# Patient Record
Sex: Male | Born: 1950 | Race: White | Hispanic: No | Marital: Married | Smoking: Current some day smoker
Health system: Southern US, Community
[De-identification: ages and names within clinical notes are randomized; demographics above are authoritative.]

## PROBLEM LIST (undated history)

## (undated) DIAGNOSIS — M5412 Radiculopathy, cervical region: Secondary | ICD-10-CM

## (undated) DIAGNOSIS — M779 Enthesopathy, unspecified: Secondary | ICD-10-CM

## (undated) DIAGNOSIS — R55 Syncope and collapse: Secondary | ICD-10-CM

## (undated) DIAGNOSIS — F419 Anxiety disorder, unspecified: Secondary | ICD-10-CM

## (undated) DIAGNOSIS — I2510 Atherosclerotic heart disease of native coronary artery without angina pectoris: Secondary | ICD-10-CM

## (undated) DIAGNOSIS — M47812 Spondylosis without myelopathy or radiculopathy, cervical region: Secondary | ICD-10-CM

## (undated) DIAGNOSIS — F319 Bipolar disorder, unspecified: Secondary | ICD-10-CM

## (undated) DIAGNOSIS — Z950 Presence of cardiac pacemaker: Secondary | ICD-10-CM

## (undated) DIAGNOSIS — I509 Heart failure, unspecified: Secondary | ICD-10-CM

## (undated) DIAGNOSIS — E785 Hyperlipidemia, unspecified: Secondary | ICD-10-CM

## (undated) DIAGNOSIS — E669 Obesity, unspecified: Secondary | ICD-10-CM

## (undated) DIAGNOSIS — I10 Essential (primary) hypertension: Secondary | ICD-10-CM

## (undated) DIAGNOSIS — M25569 Pain in unspecified knee: Secondary | ICD-10-CM

## (undated) DIAGNOSIS — F32A Depression, unspecified: Secondary | ICD-10-CM

## (undated) DIAGNOSIS — IMO0001 Reserved for inherently not codable concepts without codable children: Secondary | ICD-10-CM

## (undated) DIAGNOSIS — M542 Cervicalgia: Secondary | ICD-10-CM

## (undated) DIAGNOSIS — J449 Chronic obstructive pulmonary disease, unspecified: Secondary | ICD-10-CM

## (undated) DIAGNOSIS — I219 Acute myocardial infarction, unspecified: Secondary | ICD-10-CM

## (undated) DIAGNOSIS — G4733 Obstructive sleep apnea (adult) (pediatric): Secondary | ICD-10-CM

## (undated) DIAGNOSIS — R079 Chest pain, unspecified: Secondary | ICD-10-CM

## (undated) DIAGNOSIS — F329 Major depressive disorder, single episode, unspecified: Secondary | ICD-10-CM

## (undated) DIAGNOSIS — J45909 Unspecified asthma, uncomplicated: Secondary | ICD-10-CM

## (undated) HISTORY — DX: Obesity, unspecified: E66.9

## (undated) HISTORY — DX: Radiculopathy, cervical region: M54.12

## (undated) HISTORY — DX: Syncope and collapse: R55

## (undated) HISTORY — DX: Enthesopathy, unspecified: M77.9

## (undated) HISTORY — DX: Spondylosis without myelopathy or radiculopathy, cervical region: M47.812

## (undated) HISTORY — DX: Presence of cardiac pacemaker: Z95.0

## (undated) HISTORY — DX: Pain in unspecified knee: M25.569

## (undated) HISTORY — DX: Cervicalgia: M54.2

## (undated) HISTORY — DX: Heart failure, unspecified: I50.9

## (undated) HISTORY — DX: Atherosclerotic heart disease of native coronary artery without angina pectoris: I25.10

## (undated) HISTORY — DX: Chronic obstructive pulmonary disease, unspecified: J44.9

## (undated) HISTORY — DX: Chest pain, unspecified: R07.9

## (undated) HISTORY — DX: Unspecified asthma, uncomplicated: J45.909

## (undated) HISTORY — DX: Essential (primary) hypertension: I10

## (undated) HISTORY — DX: Obstructive sleep apnea (adult) (pediatric): G47.33

## (undated) HISTORY — DX: Hyperlipidemia, unspecified: E78.5

---

## 1973-01-14 HISTORY — PX: APPENDECTOMY: SHX54

## 1997-06-29 ENCOUNTER — Ambulatory Visit (HOSPITAL_BASED_OUTPATIENT_CLINIC_OR_DEPARTMENT_OTHER): Admission: RE | Admit: 1997-06-29 | Discharge: 1997-06-29 | Payer: Self-pay | Admitting: Orthopedic Surgery

## 1997-09-02 ENCOUNTER — Encounter: Admission: RE | Admit: 1997-09-02 | Discharge: 1997-09-02 | Payer: Self-pay | Admitting: *Deleted

## 1998-02-08 ENCOUNTER — Emergency Department (HOSPITAL_COMMUNITY): Admission: EM | Admit: 1998-02-08 | Discharge: 1998-02-08 | Payer: Self-pay | Admitting: Emergency Medicine

## 1998-02-08 ENCOUNTER — Encounter: Payer: Self-pay | Admitting: Emergency Medicine

## 1998-02-10 ENCOUNTER — Ambulatory Visit: Admission: RE | Admit: 1998-02-10 | Discharge: 1998-02-10 | Payer: Self-pay | Admitting: *Deleted

## 1998-02-10 ENCOUNTER — Encounter: Payer: Self-pay | Admitting: *Deleted

## 1998-02-15 ENCOUNTER — Encounter: Admission: RE | Admit: 1998-02-15 | Discharge: 1998-02-15 | Payer: Self-pay | Admitting: *Deleted

## 1998-02-27 ENCOUNTER — Encounter: Admission: RE | Admit: 1998-02-27 | Discharge: 1998-05-28 | Payer: Self-pay | Admitting: *Deleted

## 1998-03-01 ENCOUNTER — Encounter: Admission: RE | Admit: 1998-03-01 | Discharge: 1998-05-30 | Payer: Self-pay | Admitting: *Deleted

## 1999-10-04 ENCOUNTER — Encounter: Payer: Self-pay | Admitting: Family Medicine

## 1999-10-04 ENCOUNTER — Ambulatory Visit (HOSPITAL_COMMUNITY): Admission: RE | Admit: 1999-10-04 | Discharge: 1999-10-04 | Payer: Self-pay | Admitting: Family Medicine

## 1999-11-04 ENCOUNTER — Ambulatory Visit (HOSPITAL_BASED_OUTPATIENT_CLINIC_OR_DEPARTMENT_OTHER): Admission: RE | Admit: 1999-11-04 | Discharge: 1999-11-04 | Payer: Self-pay | Admitting: Family Medicine

## 2000-03-29 ENCOUNTER — Ambulatory Visit (HOSPITAL_COMMUNITY): Admission: RE | Admit: 2000-03-29 | Discharge: 2000-03-29 | Payer: Self-pay | Admitting: Orthopedic Surgery

## 2000-04-25 ENCOUNTER — Ambulatory Visit (HOSPITAL_COMMUNITY): Admission: RE | Admit: 2000-04-25 | Discharge: 2000-04-25 | Payer: Self-pay | Admitting: Orthopedic Surgery

## 2000-08-28 ENCOUNTER — Emergency Department (HOSPITAL_COMMUNITY): Admission: EM | Admit: 2000-08-28 | Discharge: 2000-08-28 | Payer: Self-pay | Admitting: Emergency Medicine

## 2000-11-21 ENCOUNTER — Encounter: Payer: Self-pay | Admitting: Orthopedic Surgery

## 2000-11-27 ENCOUNTER — Observation Stay (HOSPITAL_COMMUNITY): Admission: RE | Admit: 2000-11-27 | Discharge: 2000-11-28 | Payer: Self-pay | Admitting: Orthopedic Surgery

## 2000-12-15 ENCOUNTER — Encounter: Admission: RE | Admit: 2000-12-15 | Discharge: 2001-01-12 | Payer: Self-pay | Admitting: Orthopedic Surgery

## 2001-05-22 ENCOUNTER — Ambulatory Visit (HOSPITAL_COMMUNITY): Admission: RE | Admit: 2001-05-22 | Discharge: 2001-05-23 | Payer: Self-pay | Admitting: Cardiology

## 2001-05-22 ENCOUNTER — Encounter: Payer: Self-pay | Admitting: Cardiology

## 2001-11-11 ENCOUNTER — Observation Stay (HOSPITAL_COMMUNITY): Admission: RE | Admit: 2001-11-11 | Discharge: 2001-11-13 | Payer: Self-pay | Admitting: Orthopedic Surgery

## 2001-11-11 HISTORY — PX: SHOULDER ARTHROSCOPY: SHX128

## 2001-11-19 ENCOUNTER — Ambulatory Visit (HOSPITAL_COMMUNITY): Admission: RE | Admit: 2001-11-19 | Discharge: 2001-11-19 | Payer: Self-pay | Admitting: Internal Medicine

## 2001-11-23 ENCOUNTER — Encounter: Admission: RE | Admit: 2001-11-23 | Discharge: 2002-02-21 | Payer: Self-pay | Admitting: Orthopedic Surgery

## 2002-02-12 ENCOUNTER — Ambulatory Visit (HOSPITAL_BASED_OUTPATIENT_CLINIC_OR_DEPARTMENT_OTHER): Admission: RE | Admit: 2002-02-12 | Discharge: 2002-02-12 | Payer: Self-pay | Admitting: Internal Medicine

## 2002-06-11 ENCOUNTER — Ambulatory Visit (HOSPITAL_COMMUNITY): Admission: RE | Admit: 2002-06-11 | Discharge: 2002-06-12 | Payer: Self-pay | Admitting: Internal Medicine

## 2002-06-11 ENCOUNTER — Encounter: Payer: Self-pay | Admitting: Internal Medicine

## 2002-06-11 DIAGNOSIS — Z950 Presence of cardiac pacemaker: Secondary | ICD-10-CM

## 2002-06-11 HISTORY — PX: PACEMAKER INSERTION: SHX728

## 2002-06-11 HISTORY — DX: Presence of cardiac pacemaker: Z95.0

## 2002-06-12 ENCOUNTER — Encounter: Payer: Self-pay | Admitting: Internal Medicine

## 2002-08-04 ENCOUNTER — Encounter: Payer: Self-pay | Admitting: Emergency Medicine

## 2002-08-05 ENCOUNTER — Inpatient Hospital Stay (HOSPITAL_COMMUNITY): Admission: EM | Admit: 2002-08-05 | Discharge: 2002-08-06 | Payer: Self-pay | Admitting: Emergency Medicine

## 2002-08-06 ENCOUNTER — Encounter: Payer: Self-pay | Admitting: Cardiology

## 2003-01-16 ENCOUNTER — Emergency Department (HOSPITAL_COMMUNITY): Admission: AD | Admit: 2003-01-16 | Discharge: 2003-01-16 | Payer: Self-pay | Admitting: Family Medicine

## 2003-10-07 ENCOUNTER — Ambulatory Visit: Payer: Self-pay | Admitting: Internal Medicine

## 2003-10-20 ENCOUNTER — Ambulatory Visit: Payer: Self-pay | Admitting: Internal Medicine

## 2004-03-16 ENCOUNTER — Encounter: Admission: RE | Admit: 2004-03-16 | Discharge: 2004-06-14 | Payer: Self-pay | Admitting: Orthopedic Surgery

## 2004-04-05 ENCOUNTER — Ambulatory Visit: Payer: Self-pay | Admitting: Internal Medicine

## 2004-04-17 ENCOUNTER — Ambulatory Visit: Payer: Self-pay | Admitting: Family Medicine

## 2004-05-30 ENCOUNTER — Ambulatory Visit: Payer: Self-pay | Admitting: Family Medicine

## 2004-07-11 ENCOUNTER — Ambulatory Visit: Payer: Self-pay | Admitting: Internal Medicine

## 2004-07-13 ENCOUNTER — Ambulatory Visit: Payer: Self-pay | Admitting: Family Medicine

## 2004-08-28 ENCOUNTER — Ambulatory Visit: Payer: Self-pay | Admitting: Family Medicine

## 2004-10-31 ENCOUNTER — Encounter: Admission: RE | Admit: 2004-10-31 | Discharge: 2005-01-29 | Payer: Self-pay | Admitting: Orthopedic Surgery

## 2005-03-22 ENCOUNTER — Ambulatory Visit: Payer: Self-pay | Admitting: Family Medicine

## 2005-05-10 ENCOUNTER — Ambulatory Visit: Admission: RE | Admit: 2005-05-10 | Discharge: 2005-05-10 | Payer: Self-pay | Admitting: Orthopedic Surgery

## 2005-05-22 ENCOUNTER — Ambulatory Visit: Payer: Self-pay | Admitting: Internal Medicine

## 2005-05-23 ENCOUNTER — Ambulatory Visit: Payer: Self-pay | Admitting: Internal Medicine

## 2005-06-17 ENCOUNTER — Ambulatory Visit (HOSPITAL_COMMUNITY): Admission: RE | Admit: 2005-06-17 | Discharge: 2005-06-17 | Payer: Self-pay | Admitting: Orthopedic Surgery

## 2005-06-17 HISTORY — PX: KNEE ARTHROSCOPY: SUR90

## 2005-10-20 ENCOUNTER — Emergency Department (HOSPITAL_COMMUNITY): Admission: EM | Admit: 2005-10-20 | Discharge: 2005-10-20 | Payer: Self-pay | Admitting: Family Medicine

## 2005-12-08 ENCOUNTER — Emergency Department (HOSPITAL_COMMUNITY): Admission: EM | Admit: 2005-12-08 | Discharge: 2005-12-08 | Payer: Self-pay | Admitting: Emergency Medicine

## 2006-04-23 ENCOUNTER — Emergency Department (HOSPITAL_COMMUNITY): Admission: EM | Admit: 2006-04-23 | Discharge: 2006-04-23 | Payer: Self-pay | Admitting: Emergency Medicine

## 2006-04-25 ENCOUNTER — Ambulatory Visit: Payer: Self-pay | Admitting: Internal Medicine

## 2007-02-24 ENCOUNTER — Encounter (INDEPENDENT_AMBULATORY_CARE_PROVIDER_SITE_OTHER): Payer: Self-pay | Admitting: Family Medicine

## 2008-04-14 IMAGING — CR DG CHEST 2V
2 series · 2 of 2 positions shown · non-contrast
Comparison: 01/16/03.

CLINICAL DATA: Pre-op for knee surgery.
 CHEST - 2 VIEW:

[view not recorded (1 of 2)]
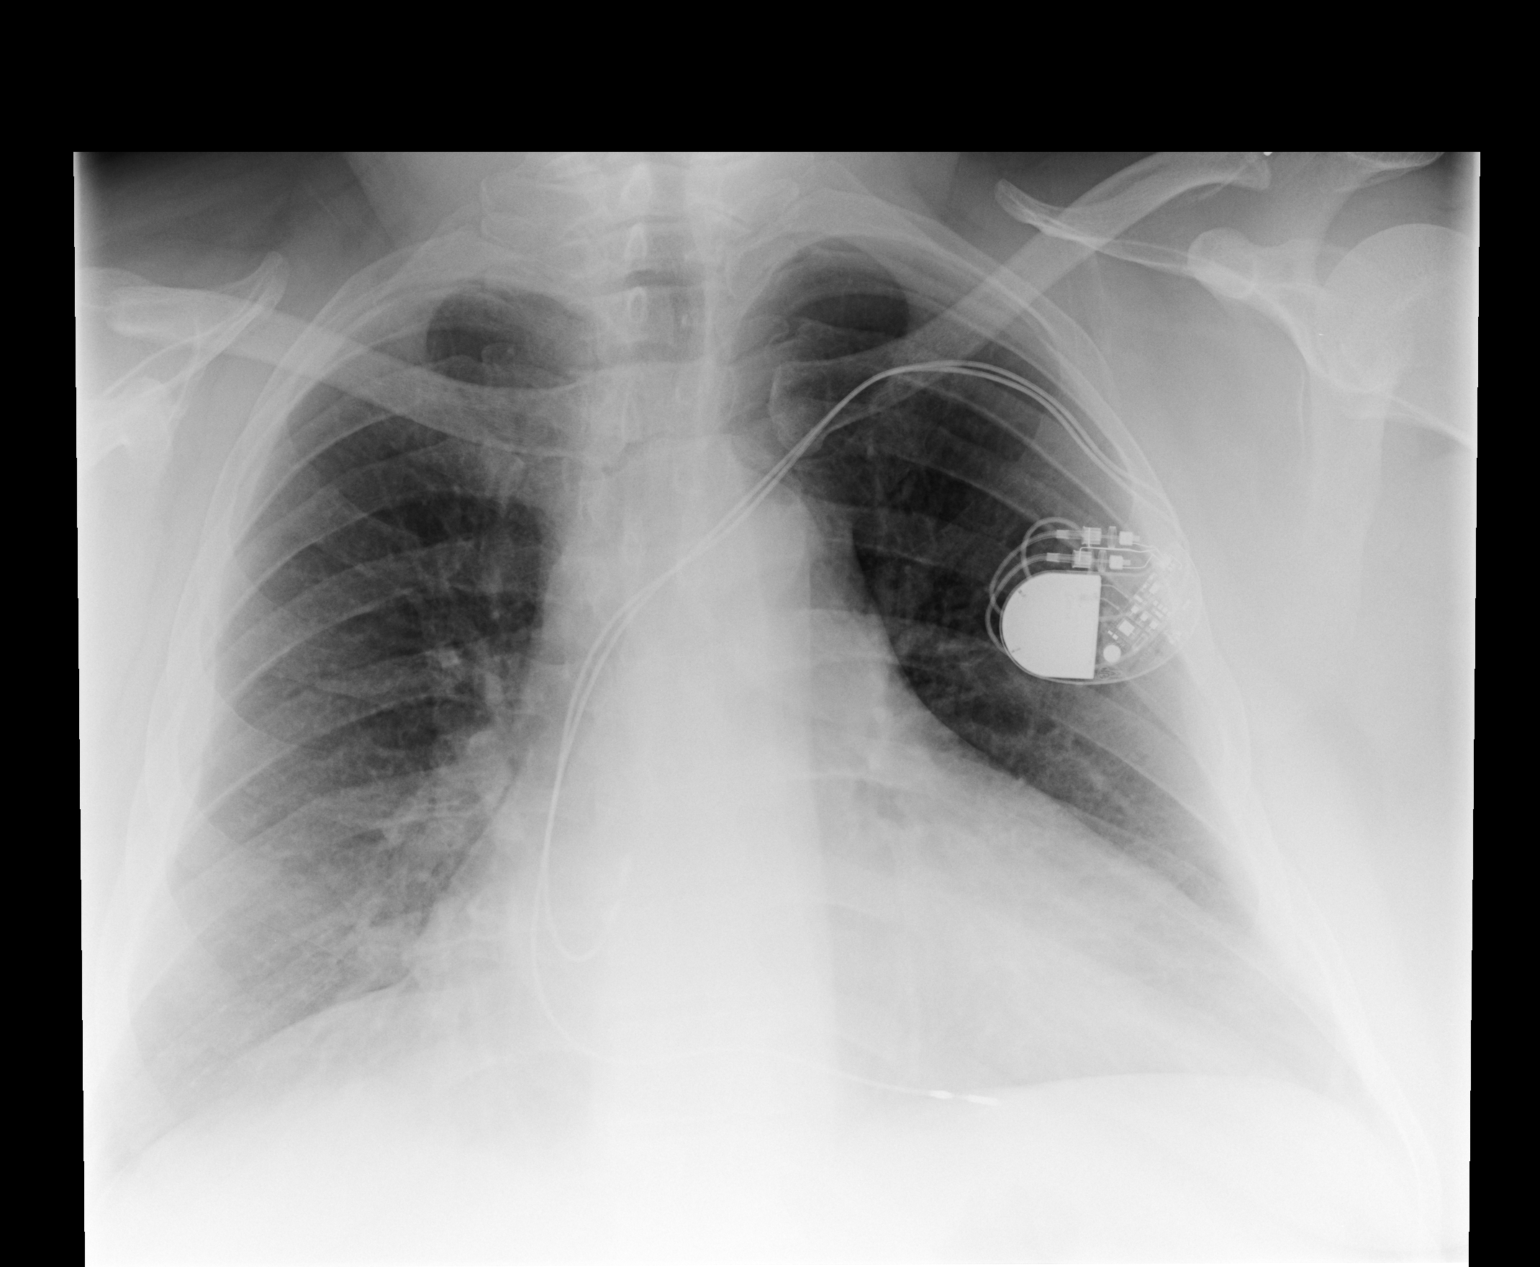

[view not recorded (2 of 2)]
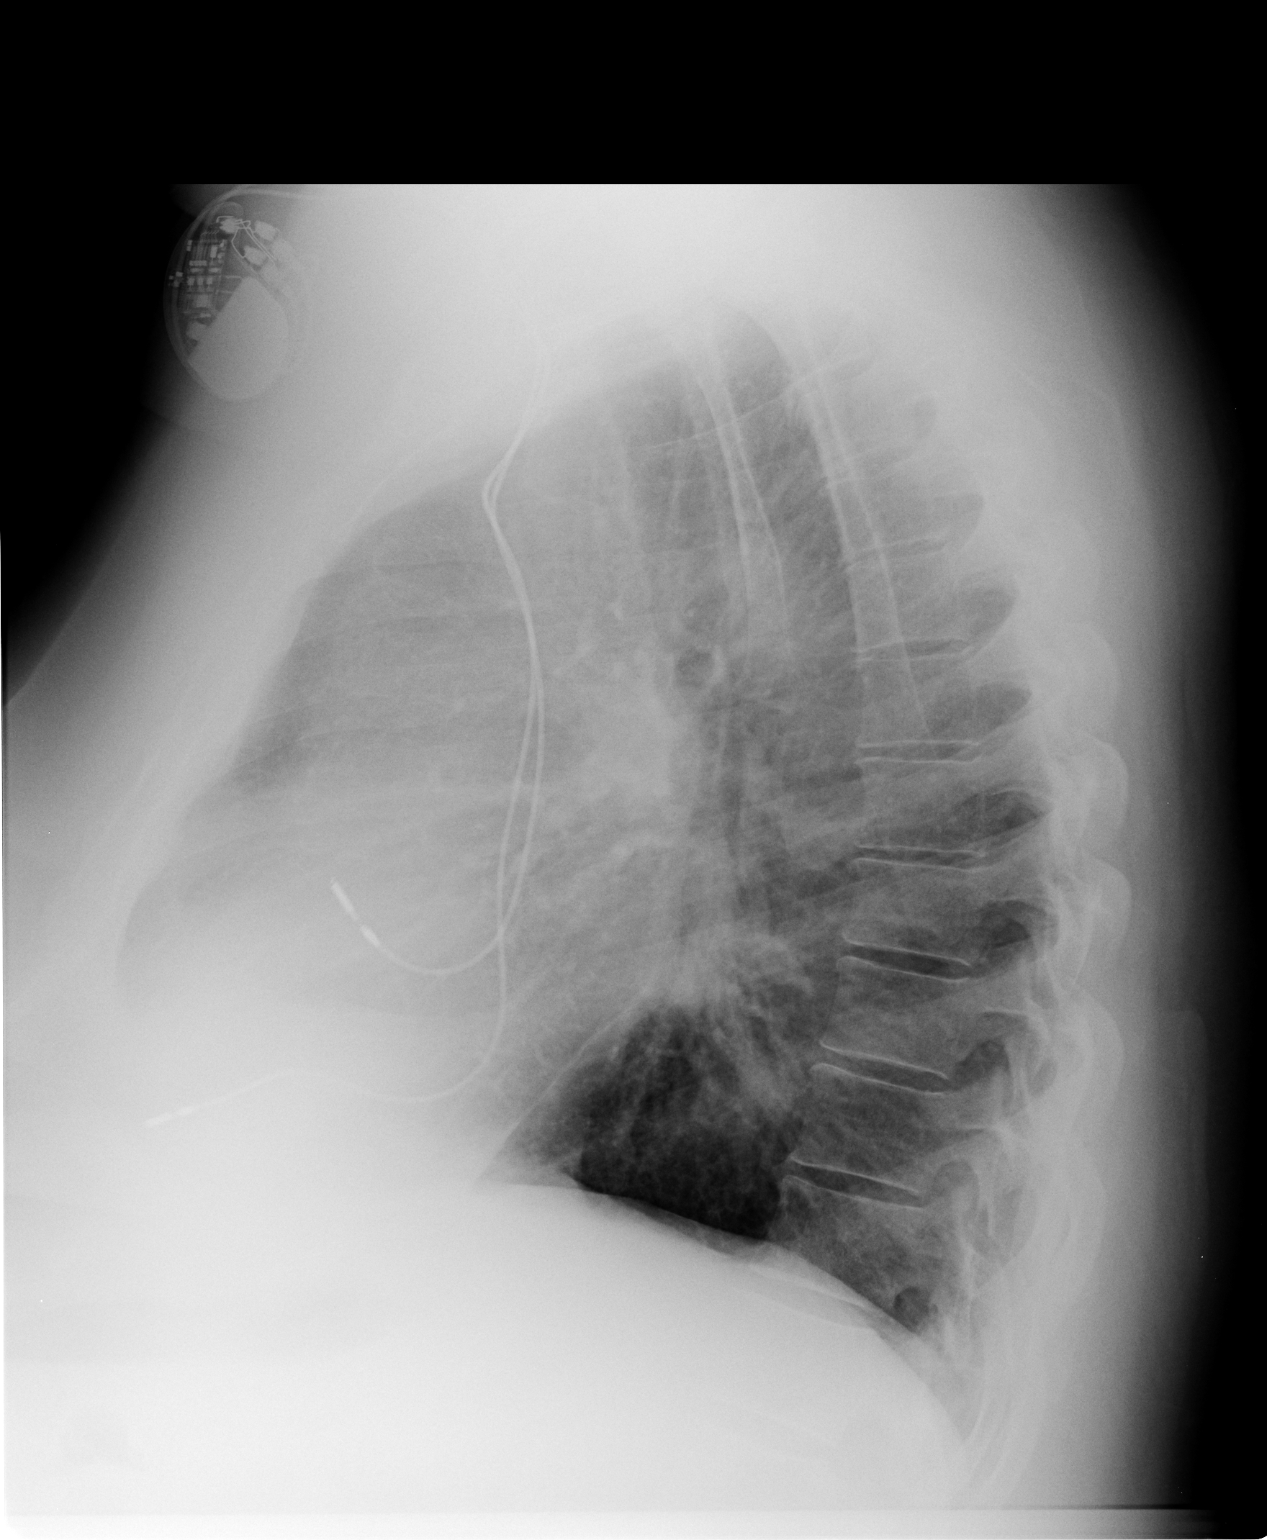

[2 of 2 positions shown; findings below may reference images not displayed]

FINDINGS: The heart is enlarged with a dual-lead pacer.  No congestive heart failure or active disease.  The lungs are mildly hyperaerated.
IMPRESSION: Cardiomegaly with dual-lead pacer ? no active disease.

## 2009-09-04 ENCOUNTER — Encounter (INDEPENDENT_AMBULATORY_CARE_PROVIDER_SITE_OTHER): Payer: Self-pay | Admitting: *Deleted

## 2009-11-03 ENCOUNTER — Encounter: Payer: Self-pay | Admitting: Internal Medicine

## 2010-02-13 NOTE — Letter (Signed)
Summary: Device-Delinquent Check  [facility], Main Office  [address]   [place], [place] [address]   Phone: [phone]  Fax: [phone]     September 04, 2009 MRN: [id number]   [facility] [address] [place], [place]  [address]   Dear Mr. Giovanelli,  According to our records, you have not had your implanted device checked in the recommended period of time.  We are unable to determine appropriate device function without checking your device on a regular basis.  Please call our office to schedule an appointment, with Dr. Ladona Ridgel,  as soon as possible.  If you are having your device checked by another physician, please call us so that we may update our records.  Thank you,  Altha Harm, LPN  September 04, 2009 8:37 AM  [facility]  certified mail

## 2010-02-13 NOTE — Cardiovascular Report (Signed)
Summary: Certified Letter - Returned  Certified Letter - Delivered   Imported By: Debby Freiberg 11/13/2009 14:44:34  _____________________________________________________________________  External Attachment:    Type:   Image     Comment:   External Document

## 2010-06-01 NOTE — H&P (Signed)
[name], [name]                         ACCOUNT NO.:  [id number]   MEDICAL RECORD NO.:  [id number]                   PATIENT TYPE:  INP   LOCATION:  2025                                 FACILITY:  [facility]   PHYSICIAN:  Rollene Rotunda, M.D.                DATE OF BIRTH:  [birth year]   DATE OF ADMISSION:  08/04/2002  DATE OF DISCHARGE:                                HISTORY & PHYSICAL   PRIMARY CARE PHYSICIAN:  Dr. Luciana Axe.   PULMONOLOGIST:  Dr. Maple Hudson.   CARDIOLOGIST:  Dr. Lewayne Bunting.   REASON FOR EVALUATION:  To evaluate patient for chest pain.   HISTORY OF PRESENT ILLNESS:  The patient is a 60 year old gentleman with a  history of nonobstructive coronary disease in the past, as described below.  He reports that for three days he has had chest discomfort.  This is a  midsternal pressure.  It was off and on; however, it became more severe and  more prolonged.  He took about 4 nitroglycerins over the past three days.  Each time, he would have resolution of the discomfort.  However, when the  discomfort became most intense this evening, he presented to the emergency  room.  He is currently pain free.  He reports that the discomfort was 5/10  at its worst.  It was associated with diaphoresis and shortness of breath.  There was no radiation.  He has had similar discomfort in the past.   PAST MEDICAL HISTORY:  1. Syncope with bradycardia, status post permanent pacemaker placement, Jun 11, 2002.  2. Sleep apnea with CPAP.  3. Hyperlipidemia.  4. Bronchospasm.  5. Depression.  6. Hypertension.  7. Morbid obesity.  8. Tobacco abuse.  9. Duodenal ulcer.  10.      Arthritis.  11.      Bursitis.   PAST SURGICAL HISTORY:  1. Bilateral shoulder surgery.  2. Bilateral carpal tunnel surgery.  3. Appendectomy.   ALLERGIES:  PENICILLIN.   MEDICATIONS:  1. Lescol 40 mg q.d.  2. Omeprazole 40 mg q.h.s.  3. Benazepril 20 mg q.a.m.  4. Zoloft 200 mg q.a.m.  5.  Hydrochlorothiazide 12.5 mg q.d.  6. Aspirin 81 mg q.d.  7. Bextra 200 mg b.i.d.  8. Celebrex 100 mg q.12h.  9. Trental 15 mg p.r.n.  10.      Advair 250/50, 1 puff b.i.d.  11.      Combivent 2 puffs b.i.d.  12.      Albuterol 2 puffs q.d.  13.      Nasonex 50 mg 2 puffs, both nostrils, b.i.d.  14.      Nasacort 2 puffs b.i.d.  15.      Pulmicort 200 mg 2 puffs b.i.d.  16.      Allegra 60 mg q.12h.   SOCIAL HISTORY:  The patient is disabled from his arthritis.  He does,  apparently, do some work, however.  He is divorced, although he has a male  friend in the room with him.  He has three children, one step-child.  He  smokes two packs per day.   FAMILY HISTORY:  Is contributory for his father having a myocardial  infarction at age 55 and dying.  His mother has congestive heart failure and  was in her 67s when she developed this.   REVIEW OF SYSTEMS:  As stated in the HPI.  Positive for chronic dyspnea,  chronic back pain.  Negative for other systems.   PHYSICAL EXAMINATION:  GENERAL:  The patient is in no acute distress.  VITAL SIGNS:  Blood pressure 138/70, heart rate 88 and regular.  HEENT:  Eyes unremarkable.  Pupils equal, round, react to light.  Fundi not  visualized.  Oral mucosa unremarkable.  Poor dentition.  NECK:  No jugular venous distention; waveform within normal limits.  Carotid  upstroke brisk and symmetric.  No bruits.  No thyromegaly.  LYMPHATICS:  No obvious cervical, axillary, inguinal adenopathy (exam  compromised by obesity).  LUNGS:  Diffuse expiratory wheezes but no crackles.  BACK:  No costovertebral angle tenderness.  CHEST:  Unremarkable.  HEART:  PMI not displaced or sustained.  S1 and S2 within normal limits.  No  S3, no S4, no murmurs.  ABDOMEN:  Morbidly obese.  Positive bowel sounds.  Normal frequency and  pitch.  No bruits, rebound, guarding.  No midline pulsatile mass.  No  organomegaly.  SKIN:  No rash.  No masses.  EXTREMITIES:  2+ pulses  throughout.  No edema.  No cyanosis.  No clubbing.  NEUROLOGIC:  Oriented to person, place and time.  Cranial nerves II-XII  grossly intact.  Motor grossly intact.   LABORATORY DATA:  Sodium 138, potassium 3.8, chloride 108, BUN 16.  CK-MB  3.3, troponin less than 0.5.   EKG:  Sinus rhythm, rate 85, poor anterior R wave progression, possible old  septal infarct.  Nonspecific T wave flattening throughout.   ASSESSMENT/PLAN:  1. Chest discomfort.  The patient's chest discomfort is worrisome for     unstable angina.  Non-obstructive disease in 2003 and continues to smoke.     Given the description, I think it is best to treat him with heparin and     aspirin.  I will increase this to 325 mg.  I will avoid beta blockers     because of bronchospasm.  He will need an elective cardiac     catheterization.  He understands the risks and benefits and agrees to     proceed.  2. Morbid obesity.  He will be counseled about weight loss with diet.  3. Tobacco.  We will get a stop-smoking consult.  4. Hyperlipidemia.  He will continue his Lescol, and we will check a lipid     profile.  5. Hypertension.  He will continue the medications as listed.  6. Bronchospasm.  Again, we will avoid a beta blocker and continue the     inhalers and other medicines as listed.  7. Sleep apnea.  He can use his home CPAP machine while he is here.  8. Status post pacemaker.                                               Rollene Rotunda, M.D.  JH/MEDQ  D:  08/04/2002  T:  08/05/2002  Job:  [id number]   cc:   Barton Fanny, M.D.    cc:   Barton Fanny, M.D.

## 2010-06-01 NOTE — Discharge Summary (Signed)
NAMESARVESH, MEDDAUGH                         ACCOUNT NO.:  [id number]   MEDICAL RECORD NO.:  [id number]                   PATIENT TYPE:  OIB   LOCATION:  4706                                 FACILITY:  [facility]   PHYSICIAN:  Doylene Canning. Ladona Ridgel, M.D.               DATE OF BIRTH:  09-05-1950   DATE OF ADMISSION:  06/11/2002  DATE OF DISCHARGE:  06/12/2002                           DISCHARGE SUMMARY - REFERRING   BRIEF HISTORY:  The patient is a 60 year old male who was seen in the office  on Jun 08, 2002.  He has a history of hypertension, obesity, treated  hyperlipidemia, obstructive sleep apnea, intermittent bronchospasm and a  history of minimal coronary artery disease.  He was seen on May 24, 2002, by  Dian Queen, P.A.-C for evaluation of syncope, palpitations and presyncope.  The patient had no further syncope.  He did have a 2D echocardiogram  performed that showed normal left ventricular function.  He did have mild  aortic regurgitation.  A Holter monitor revealed significant bradycardia  with pauses as long as four seconds.  The patient was also found to have  Mobitz type II block on several occasions.  The decision was made to admit  the patient to [facility] for placement of a permanent pacemaker.   PAST MEDICAL HISTORY:  The patient had a cardiac catheterization April 2003,  that revealed a 25% proximal LAD, 30% ostial first diagonal and a normal  circumflex.  The first obtuse marginal had a 60% lesion.  The PDA had a 25%  lesion.  He had normal LV function.  He has a history of hypertension,  hyperlipidemia, palpitations and syncope, obstructive sleep apnea, COPD with  intermittent bronchospasm, DJD with bilateral shoulder arthritis, rotator  cuff impingement, status post bilateral rotator cuff surgery.  He has a  history of obesity.   ALLERGIES:  No known drug allergies.   MEDICATIONS:  1. Lescol 20 mg daily.  2. Lotensin 20 mg daily.  3. Prilosec 20 mg  b.i.d.  4. Advair 250/50 two puffs b.i.d.  5. Combivent metered dose inhaler, two puffs b.i.d.  6. Zoloft 100 mg, two q.a.m.  7. Hydrochlorothiazide 25 mg daily.  8. Nasacort, dosage not available.  9. Allegra 180 mg daily.  10.      Celebrex 200 mg daily.  11.      CPAP at night.  12.      Albuterol p.r.n.  13.      Tramadol 50 mg p.r.n.  14.      Nitroglycerin p.r.n.   SOCIAL HISTORY:  The patient continues to smoke.  He has smoked x40 years,  20-30 pack year history.   HOSPITAL COURSE:  As noted, the patient was admitted to [facility]  for placement of a permanent pacemaker secondary to syncope as well as  presyncope with documented four second pauses and Mobitz type II AV block.  Dr. Ladona Ridgel discussed the risks and benefits on Jun 11, 2002.  The patient  underwent placement of a St. Jude pulse generator permanent pacemaker using  Pacesetter leads.  The patient tolerated this well.   A followup chest x-ray showed COPD but no sign of any pneumothorax.  The  patient was seen on rounds the following day.  He appeared to be doing well.  Arrangements were made to discharge the patient in stable and improved  condition.   LABORATORY DATA:  Please see results of chest x-rays as noted above.  Cardiac enzymes were found to be negative x2.  There were no other  laboratories performed in the hospital.   DISCHARGE MEDICATIONS:  The patient is told to continue his home  medications.  He was told to take Tylenol as needed for pain.   DISCHARGE INSTRUCTIONS:  He was to limit the use of his left upper extremity  as instructed by Dr. Ladona Ridgel.  He was to be on a low-salt, low-fat diet.  He  was told to keep his pacer site clean and dry for one week.  He was told to  try to quit smoking.  He was told to call the office on Tuesday following  the holiday weekend for a followup appointment with Dr. Ladona Ridgel as well as an  appointment to have his pacemaker wound checked.  He was to follow up  with  his primary care physician, Dr. Beverley Fiedler, as needed.   DISCHARGE DIAGNOSES:  1. Status post St. Jude DDD permanent pacemaker performed Jun 11, 2002, by     Dr. Lewayne Bunting, secondary to second degree atrioventricular block, type     2, associated with presyncope and four second pauses.  The patient also     had syncope approximately six weeks ago.  2. Minimal coronary artery disease by catheterization April 2003.  3. Treated hypertension.  4. Treated hyperlipidemia.  5. Obstructive sleep apnea.  6. History of bilateral rotator cuff problems.  7. Chronic obstructive pulmonary disease with intermittent bronchospasm.  8. Continued tobacco use.     Kinnie Scales. Rinehuls, P.A.-C  [facility]            Doylene Canning. Ladona Ridgel, M.D.    DLR/MEDQ  D:  06/12/2002  T:  06/12/2002  Job:  [id number]   cc:   Fanny Dance. Rankins, M.D.  [address]  [place]  [place] [address]  Fax: [phone]

## 2010-06-01 NOTE — Discharge Summary (Signed)
[place]  Patient:    CALEEL, KINER Visit Number: [id number] MRN: [id number]          Service Type: CAT Location: 6500 [id number] [id number] Attending Physician:  Rollene Rotunda Dictated by:   Rozell Searing, P.A. Admit Date:  05/22/2001 Disc. Date: 05/23/01   CC:         Barton Fanny, M.D.   Referring Physician Discharge [place]  PROCEDURES:  Coronary angiogram.  REASON FOR ADMISSION:  Please refer to dictated admission note.  HOSPITAL COURSE:  The patient presented for elective coronary angiography, performed by Rollene Rotunda, M.D. (see catheterization report for full details), which revealed moderate coronary artery disease with normal left ventricular function.  Specifically, Rollene Rotunda, M.D., noted 25% proximal/mid LAD, 30% ostial DX1, 60% proximal/25% mid OMO, diffuse luminal irregularities of RCA, and 25% ostial PDA.  Rollene Rotunda, M.D., recommended aggressive risk factor modification and outpatient pharmacologic stress Cardiolite for assessment of CFX lesion.  The patient was kept for overnight observation of a minimal right groin hematoma and oozing.  Follow-up evaluation the following morning revealed no presence of hematoma or bruit and the patient was cleared for discharge in hemodynamically stable condition.  No new medication adjustments this admission.  MEDICATIONS AT DISCHARGE:  Previous home medication regimen.  ACTIVITY:  No heavy lifting, strenuous activity, or driving x 2 days.  DIET:  Low-fat/cholesterol diet.  SPECIAL INSTRUCTIONS:  The patient is strongly advised to discontinue smoking tobacco.  FOLLOW-UP:  The patient is scheduled for adenosine Cardiolite on Tuesday, May 26, 2001, at 9:30 a.m. at [facility].  The patient is scheduled to follow up with Rozell Searing, P.A., on Jun 03, 2001, at 11 a.m.  DISCHARGE DIAGNOSES: 1. Moderate coronary artery disease.    a. Coronary angiogram on May 22, 2001.    b. Normal coronary angiogram in 2000. 2. Multiple cardiac risk factors.    a. Hypertension.    b. Dyslipidemia.    c. Tobacco.    d. Family history of premature coronary artery disease. 3. History of duodenal ulcer. 4. Sleep apnea. 5. Asthma. 6. Depression. Dictated by:   Rozell Searing, P.A. Attending Physician:  Rollene Rotunda DD:  05/23/01 TD:  05/23/01 Job: [id number]

## 2010-06-01 NOTE — Op Note (Signed)
[facility]  Patient:    [name], [name] Visit Number: [id number] MRN: [id number]          Service Type: SUR Location: 4W 0452 01 Attending Physician:  Verlee Rossetti. Dictated by:   Almedia Balls Ranell Patrick, M.D. Proc. Date: 11/27/00 Admit Date:  11/27/2000                             Operative Report  PREOPERATIVE DIAGNOSES: 1. Left wrist carpal tunnel syndrome. 2. Left shoulder impingement syndrome. 3. Left shoulder acromioclavicular joint arthritis.  POSTOPERATIVE DIAGNOSES: 1. Left wrist carpal tunnel syndrome. 2. Left shoulder impingement syndrome. 3. Left shoulder acromioclavicular joint arthritis.  PROCEDURES:  Left shoulder examination under anesthesia, diagnostic glenohumeral arthroscopy, intra-articular debridement of superior labrale fraying, arthroscopic bursectomy, subacromial decompression with acromioplasty, open distal clavicle excision, and left carpal tunnel release.  SURGEON:  Almedia Balls. Ranell Patrick, M.D.  FIRST ASSISTANT:  Marcie Bal. Troncale, P.A.-C.  ANESTHESIA:  General.  ESTIMATED BLOOD LOSS:  Minimal.  FLUID REPLACEMENT:  1300 cc crystalloid.  INSTRUMENT COUNTS:  Correct.  COMPLICATIONS:  None.  PREOPERATIVE:  Antibiotics were given.  HISTORY OF PRESENT ILLNESS:  The patient is a 60 year old gentleman who presents with longstanding symptoms of carpal tunnel syndrome.  He has had positive EMG nerve conduction studies, positive Phalens and Tinels, and has failed conservative management including splint wear and activity modification.  The patient also has longstanding impingement of left shoulder, worsening range of motion with difficulty with abduction and forward flexion, as well as internal rotation.  This is effecting his activities of daily living and interfering with sleep.  He also has failed conservative management for the shoulder to include injections in the subacromial space and rotator cuff strengthening  exercises as well as activity modifications.  After discussion with Mr. Ohms regarding options for management for both his carpal tunnel syndrome and his shoulder impingement and AC joint arthritis, he has elected to proceed with surgical treatment.  DESCRIPTION OF PROCEDURE:  After an adequate level of general anesthesia was achieved, 1 gram of Ancef was given preoperatively.  The patient was positioned in the supine position on the operating room table and a nonsterile tourniquet was placed on the left proximal arm and the left arm was then prepped and draped in the usual sterile fashion.  A standard longitudinal incision was created in the palm in line with the fourth ray for carpal tunnel release.  This was done using a 15 blade scalpel.  Dissection was carried across the wrist crease using an angled incision, crossing at approximately 45 degrees, extending for 1 cm out to the volar forearm.  This was taken sharply down through subcutaneous tissues using curved tenotomy scissors.  The superficial palmar fascia was identified and incised in line with the skin incision.  Next the transverse carpal ligament was identified and incised sharply using a knife under direct loupe visualization.  Care was taken to preserve the median nerve.  The transverse carpal ligament was released all the way into the palm, such that the operating surgeons finger could be placed in the palm without meeting resistance.  Care was taken to preserve the palmar arch blood supply as well.  The median nerve was identified.  It had a mild compressive deformity and there was some mild synovial inflammation in the carpal canal as well.  The release was carried into the volar forearm fascia which was released such that the finger  of the operating surgeon could be placed into the forearm compartment.  With the complete carpal tunnel release, the wound was thoroughly irrigated and closed using interrupted 4-0 Nylon  sutures in a vertical mattress fashion.  Next, attention was directed towards the shoulder.  An EUA was performed on the shoulder prior to starting the carpal tunnel procedure.  This was noted to have external rotation to 50 degrees, forward flexion to 140 degrees, abduction 120 degrees, and there was negative sulcus, negative anterior and posterior drawer, and no crepitus with glenohumeral grind.  At the completion of the EUA and completion of carpal tunnel surgery, a diagnostic glenohumeral arthroscopy was performed through anterior arthroscopic portals.  These were created in similar fashion with infiltration of skin with 0.5% Marcaine with epinephrine followed by incision with 11 blade scalpel.  Introduction of cannula into the joint using blunt obturators.  Diagnostic arthroscopy revealed some fraying of superior labrum, but intact attachment of the superior labrum and intact biceps junction.  The biceps tendon was delivered into the joint and noted to be normal.  There was a tiny amount of fraying noted at the very leading edge of the supraspinatus tendon, otherwise supraspinatus, infraspinatus, and teres minor tendon looked normal and normal insertions.  Glenohumeral articular cartilage was also without abnormality. Pouch was of normal size with no lose spot.  His anterior and inferior labrum was intact.  Subscapularis was visualized and noted to be intact.  Scope was placed in front of the shoulder looking posteriorly again to inspect the labrum to ensure that the overhanging that was identified was normal.  By elevating the labrum we were able to directly visualize the insertion of the labrum which was felt to be intact.  There was no peel back with external rotation and abduction.  At this point the glenohumeral arthroscopy was concluded.  The scope was placed in the subacromial space and a bursectomy was performed. Subacromial spurs were identified which were removed using a  motorized bur. An acromioplasty was then performed with care towards removing anterior and  lateral bone with a butcher block type technique.  Care was taken also to go all the way over to the [facility] joint where there was noted preoperatively to be osteophytes off both the acromion and the clavicle.  Osteophytes were removed there was well.  The CA ligament was released using the Arthrocare electrocautery unit.  Once complete acromioplasty was performed and subacromial space thoroughly irrigated, the scope was concluded.  A small saber incision was created over the distal clavicle in line with Langer line. This was done using a 15 blade scalpel.  Dissection was carried sharply down through subcutaneous tissues using Bovie electrocautery.  The deltotrapezius fascia was identified and split in line with its fibers over the distal clavicle.  Subperiosteal dissection of this clavicle was performed.  The distal 1 cm of the clavicle was removed using oscillating saw, beveling posteriorly.  The operating surgeons finger was then placed in the [facility] interval and the arm was maximally abducted and adducted across the body.  No impingement was noted.  The interval was irrigated, bone wax applied to the distal clavicle, and then the deltotrapezius interval was closed using interrupted figure-of-eight 0 Ethibond sutures with buried knots.  The subcutaneous was closed using 2-0 Vicryl and then the skin closed using 4-0 Monocryl subcuticular stitch.  Arthroscopic portals were also closed using 4-0 Monocryl, subcuticular stitch and sutures.  Steri-Strips were applied followed by a shoulder immobilizer and a short-arm  splint for the carpal tunnel with the wrist slightly dorsiflexed.  The patient will be kept overnight for observation and discharged home with Codman exercises and passive forward elevation, external and internal rotation. Dictated by:   Almedia Balls Ranell Patrick, M.D. Attending Physician:  Malon Kindle R. DD:  11/27/00 TD:  11/27/00 Job: [id number]

## 2010-06-01 NOTE — Op Note (Signed)
[name], [name]                         ACCOUNT NO.:  [id number]   MEDICAL RECORD NO.:  [id number]                   PATIENT TYPE:  OIB   LOCATION:  4706                                 FACILITY:  [facility]   PHYSICIAN:  Doylene Canning. Ladona Ridgel, M.D.               DATE OF BIRTH:  [birth year]   DATE OF PROCEDURE:  06/11/2002  DATE OF DISCHARGE:                                 OPERATIVE REPORT   PROCEDURE:  Implantation of a dual-chamber pacemaker.   INDICATIONS:  Symptomatic intermittent high-grade heart block with syncope.   INTRODUCTION:  The patient is a very pleasant middle-aged man with a history  of COPD, coronary artery disease, sleep apnea, who has had a history of  recurrent syncope.  He was subsequently found on cardiac monitoring to have  intermittent high-grade A-V block associated with symptoms.  He is now  referred for permanent pacemaker insertion.   DESCRIPTION OF PROCEDURE:  After informed consent was obtained, the patient  was taken to the diagnostic EP lab in a fasted state.  After the usual  preparation and draping, intravenous fentanyl and midazolam were given for  sedation.  A total of 30 mL of lidocaine was infiltrated into the left  infraclavicular region.  A 6 cm incision was carried out over this region  and electrocautery utilized to dissect down to the subpectoralis fascia.  T  en milliliters of contrast injected into the left upper extremity  demonstrated a patent left subclavian vein.  It was subsequently punctured  x2 in the extrathoracic portion and the St. Jude model 1488 58-cm active-  fixation pacing lead was placed in the right ventricle a St. Jude model 1488  active-fixation pacing lead placed in the right atrium.  The ventricular  lead serial number was [id number] and the atrial lead serial number was  [id number].  Mapping was then carried out in the right ventricle.  R-waves at  the final site measured 25 millivolts, and the pacing threshold was 0.6  volts at 0.5 msec.  The pacing impedance in the ventricle was 650 Ohms.  With the ventricular lead in satisfactory position, attention was then  turned to the atrial lead.  P-waves there measured 3 millivolts and the  atrial threshold was 0.5 volts at 0.5 msec.  The pacing impedance was 607  Ohms.  Ten-volt pacing was carried out in both the ventricle and the atrium,  demonstrating no diaphragmatic stimulation.  The leads were secured to the  subpectoralis fascia with a figure-of-eight silk suture.  The sewing sleeves  were also secured with silk suture.  Electrocautery as then utilized to make  a subcutaneous pocket.  Kanamycin irrigation was utilized to irrigate the  pocket.  The St. Jude [employer] [place] model [id number], dual-chamber pacemaker  was connected to the pacing leads and placed back in the subcutaneous  pocket.  The generator was secured with silk suture.  Kanamycin irrigation  was utilized to irrigate the incision, and the incision was then closed with  a layer of 2-0 Vicryl, followed by a layer of 3-0 Vicryl, followed by a  layer of 4-0 Vicryl.  Benzoin was painted on the skin and Steri-Strips were  applied and a pressure dressing placed and the patient returned to his room  in satisfactory condition.   COMPLICATIONS:  There were no immediate procedural complications.    RESULTS:  This demonstrates successful implantation of a St. Jude dual-  chamber pacemaker in a patient with intermittent heart block and symptomatic  bradycardia associated with syncope.                                               Doylene Canning. Ladona Ridgel, M.D.    GWT/MEDQ  D:  06/11/2002  T:  06/12/2002  Job:  [id number]   cc:   Fanny Dance. Rankins, M.D.  [address]  [place]  [place] [address]  Fax: [phone]   Rollene Rotunda, M.D.   Kathrine Cords, R.N. [facility]

## 2010-06-01 NOTE — Cardiovascular Report (Signed)
[place]  Patient:    JONANTHONY, NAHAR Visit Number: [id number] MRN: [id number]          Service Type: CAT Location: 6500 [id number] Attending Physician:  Rollene Rotunda Dictated by:   Rollene Rotunda, M.D. [facility] Proc. Date: 05/22/01 Admit Date:  05/22/2001 Discharge Date: 05/23/2001   CC:         Barton Fanny, M.D., [facility]   Cardiac Catheterization  DATE OF BIRTH:  [birth year]  PRIMARY:  Barton Fanny, M.D. at [facility].  CARDIOLOGIST:  Cecil Cranker, M.D.  NAME OF PROCEDURE: 1. Left heart catheterization. 2. Coronary arteriography.  PERFORMING CARDIOLOGIST:  Rollene Rotunda, M.D.  INDICATIONS:  Evaluate patient with chest pain (786.51).  PROCEDURAL NOTE:  Left heart catheterization was performed via the right femoral artery.  The artery was cannulated using an anterior wall puncture. Number 6 French arterial sheath was inserted via the modified Seldinger technique.  Preformed Judkins and left pigtail catheters were utilized.  The tolerated the procedure well and left the lab in stable condition.  RESULTS: HEMODYNAMICS:  LV 142/18.  AO 140/84.  CORONARIES:  Left main was normal.  The LAD had proximal 25% stenosis and mid 25% stenosis.  There was a large first diagonal with ostial 30% stenosis.  The circumflex in the AV groove was normal.  There a large mid obtuse marginal with proximal 60% stenosis and mid 25% stenosis.  Right coronary artery:  The right coronary artery was a dominant vessel with diffuse luminal irregularities.  The PDA had ostial 25% stenosis.  This lesion seemed to involve the takeoff of a small posterolateral as well as the remainder of the RCA as it tapered into two other small posterolaterals.  LEFT VENTRICULOGRAM:  The left ventriculogram was obtained in the RAO projection.  The EF was 65% with normal wall motion.  CONCLUSION: 1. Moderate coronary artery plaquing involving the  circumflex marginal. 2. Left ventricular function. 3. Minimal plaquing elsewhere.  PLAN:  The patient will have an outpatient Cardiolite to further evaluate the hemodynamic significance of the circumflex lesion.  If there is no evidence of ischemia then no further evaluation will be warranted.  The patient was counselled on risk factor modification including the need to stop smoking. Dictated by:   Rollene Rotunda, M.D. [facility] Attending Physician:  Rollene Rotunda DD:  05/22/01 TD:  05/25/01 Job: [id number]

## 2010-06-01 NOTE — Op Note (Signed)
[name], [name]               ACCOUNT NO.:  [id number]   MEDICAL RECORD NO.:  [id number]          PATIENT TYPE:  AMB   LOCATION:  SDS                          FACILITY:  [facility]   PHYSICIAN:  Almedia Balls. Ranell Patrick, M.D. DATE OF BIRTH:  [birth year]   DATE OF PROCEDURE:  06/17/2005  DATE OF DISCHARGE:  06/17/2005                                 OPERATIVE REPORT   PREOPERATIVE DIAGNOSIS:  Left knee pain secondary to medial meniscus tear.   POSTOPERATIVE DIAGNOSES:  1.  Left knee pain secondary to medial meniscus tear.  2.  Medial femoral condyle chondromalacia, grade 3.  3.  Medial tibial condyle chondromalacia, grade 4.   PROCEDURE PERFORMED:  Left knee arthroscopy with debridement of medial  meniscus tear and abrasion chondroplasty of medial femoral condyle.   ATTENDING SURGEON:  Almedia Balls. Ranell Patrick, M.D.   ASSISTANTS:  None.   General anesthesia was used.   ESTIMATED BLOOD LOSS:  Minimal.   FLUID REPLACEMENT:  1200 mL crystalloid.   INSTRUMENT COUNT:  Was correct.   COMPLICATIONS:  None.   Perioperative antibiotics were given.   INDICATIONS:  The patient is a 60 year old male with a history of worsening  left medial-sided knee pain.  The patient has had clinical signs and  symptoms consistent with a medial meniscal tear and due to pacemaker has not  been able to have an MRI scan.  However, the patient presents now for  treatment for a suspicion for medial meniscus tear.  Informed consent was  obtained.   DESCRIPTION OF PROCEDURE:  After an adequate level of anesthesia was  achieved, the patient was positioned supine on the operating room table.  The left leg was sterilely prepped and draped in the usual manner, right leg  was secured and padded.  After a sterile prep and drape, we went in and  entered the left knee arthroscopically using standard arthroscopic portals.  Superolateral outflow, anterolateral scope and anteromedial working portals  were all created in a  similar fashion with infiltration of skin with 0.25%  Marcaine with epinephrine followed by incision with the 11 blade scalpel and  introduction of the cannula into the joint using blunt obturators.  Diagnostic arthroscopy revealed normal patellofemoral articular cartilage,  no signs of medial plica syndrome.  Medial and lateral gutters free of loose  bodies.  The medial compartment was entered, there was noted to be a complex  posterior horn medial meniscus tear which extended down the meniscal root  posteriorly.  We debrided this back to stable meniscal tissue, involving  removal of about 30-40% of the posterior horn of meniscus.  The tear did not  extend into the middle or anterior horns.  The medial femoral condyle had  grade 3 chondromalacia over a fairly large area and tibial condyle had grade  4 changes out near the meniscal rim.  The ACL, PCL intact.  Lateral  compartment completely pristine including normal meniscus, normal articular  cartilage.  Following the meniscectomy and abrasion chondroplasty, we  concluded surgery with suturing the wounds with #4-0 Monocryl subcuticular  followed by Steri-Strips and a sterile  dressing.  The patient tolerated  surgery well, taken to the recovery room.           ______________________________  Almedia Balls Ranell Patrick, M.D.     SRN/MEDQ  D:  06/17/2005  T:  06/18/2005  Job:  [id number]

## 2010-06-01 NOTE — Cardiovascular Report (Signed)
[place]. [facility]  Patient:    [name], [name] Visit Number: [id number] MRN: [id number]          Service Type: CAT Location: 6500 [id number] [id number] Attending Physician:  Rollene Rotunda Dictated by:   Rollene Rotunda, M.D. [facility] Proc. Date: 05/22/01 Admit Date:  05/22/2001 Discharge Date: 05/23/2001   CC:         Barton Fanny, M.D., [facility]   Cardiac Catheterization  DATE OF BIRTH:  [birth year]  PRIMARY CARE PHYSICIAN:  Barton Fanny, M.D., [facility].  PROCEDURES PERFORMED:  Left heart catheterization/coronary arteriography.  INDICATIONS:  To evaluate the patient with chest pain (786.51).  DESCRIPTION OF PROCEDURE:  The left heart catheterization is performed via the right femoral artery. The artery was cannulated using an anterior wall puncture. A #6 French arterial sheath was inserted via the modified Seldinger technique. Preformed Judkins and pigtail catheter were utilized. The patient tolerated the procedure well and left the lab in stable condition.  RESULTS:  HEMODYNAMIC DATA: 1. Left ventricular pressure 142/18 mmHg. 2. Aortic pressure 140/84 mmHg.  CORONARY ARTERIOGRAPHY: 1. The left main coronary artery was normal. 2. The left anterior descending coronary artery had a proximal    25% stenosis and mid 25% stenosis. There was a large first    diagonal with ostial 30% stenosis. 3. The circumflex coronary artery in the AV groove was normal.    There was a large mid obtuse marginal with proximal 60%    stenosis and mid 25% stenosis. 4. The right coronary artery was a dominant vessel with diffuse    luminal irregularities. The PDA had ostial 25% stenosis. This    lesion seemed to involve the takeoff of a small posterolateral    as well as the remainder of the RCA as it tapered into two other    small posterolaterals.  LEFT VENTRICULOGRAPHY: 1. The left ventriculogram was obtained in the RAO projection. 2. The EF was 65% with  normal wall motion.  CONCLUSION: 1. Moderate coronary artery plaquing involving the circumflex    marginal. 2. Normal left ventricular function. 3. Minimal plaquing elsewhere.  PLAN:  The patient will have an outpatient Cardiolite to further evaluate the hemodynamic significance of the circumflex lesion. If there is no evidence of ischemia, then no further evaluation will be warranted. The patient was counseled on risk factor modification including the need to stop smoking. Dictated by:   Rollene Rotunda, M.D. [facility] Attending Physician:  Rollene Rotunda DD:  05/22/01 TD:  05/25/01 Job: [id number]

## 2010-06-01 NOTE — Op Note (Signed)
[name], [name]                         ACCOUNT NO.:  [id number]   MEDICAL RECORD NO.:  [id number]                   PATIENT TYPE:  AMB   LOCATION:  DAY                                  [facility]ACILITY[facility]   PHYSICIAN:  Almedia Balls. Ranell Patrick, M.D.              DATE OF BIRTH:  [birth year]   DATE OF PROCEDURE:  11/11/2001  DATE OF DISCHARGE:                                 OPERATIVE REPORT   PREOPERATIVE DIAGNOSIS:  Right shoulder rotator cuff tear, chronic  impingement syndrome, and acromioclavicular joint arthritis.   POSTOPERATIVE DIAGNOSIS:  1. Right shoulder type 4 superior labral tear anterior-posterior.  2. Right shoulder rotator cuff tear.  3. Right shoulder chronic impingement syndrome.  4. Right shoulder acromioclavicular joint arthritis.   PROCEDURES:  Right shoulder arthroscopy with extensive intra-articular  debridement of type 4 superior labrum anterior and posterior lesion, intra-  articular release of biceps tendon, followed by open subacromial  decompression acromioplasty, open distal clavicle excision, open biceps  tenodesis in the bicipital groove, and open rotator cuff repair.   SURGEON:  Almedia Balls. Ranell Patrick, M.D.   ASSISTANT:  Druscilla Brownie. Cherlynn June.   ANESTHESIA:  General anesthesia.   ESTIMATED BLOOD LOSS:  Minimal.   FLUIDS REPLACED:  2000 cc crystalloid.   COUNTS:  Instrument count was correct.   COMPLICATIONS:  There were no complications.   ANTIBIOTICS:  Preoperative antibiotics given.   INDICATIONS:  The patient is a 60 year old male who presents complaining of  worsening right shoulder pain.  The patient has a known history of prior  left shoulder rotator cuff tear, status post repair.  The patient presented  with similar symptoms, including severe night pain, difficulty with ADLs,  and inability to elevate his arm above his head.  MRI scan demonstrated  rotator cuff tear, evidence of subacromial spur formation, and an AC joint  arthrosis.   After discussing with the patient options for management, to  include continued conservative management versus surgical treatment, the  patient elected surgery.   DESCRIPTION OF PROCEDURE:  After an adequate level of anesthesia was  achieved, the patient was positioned in a modified beach chair position.  All neurovascular structures were padded appropriately.  The patient's leg,  shoulder, and arm were prepped and draped in their entirety in the usual  sterile fashion.  Diagnostic arthroscopy was performed through standard  posterior and anterior arthroscopic portals.  These were created in similar  fashion with infiltration of the skin with 0.5% Marcaine with epinephrine,  followed by incision with an 11 blade scalpel and introduction of cannula in  the joint using blunt obturators.  Diagnostic arthroscopy revealed a large  superior labral tear anterior-posterior extending into the biceps tendon  with extensive biceps fraying and tearing.  This was debrided back to stable  superior labral tissue using a full radius resector.  The biceps anchor was  not felt to be unstable; however,  due to the extreme damage to the biceps  tendon, it was decided to perform a biceps tenodesis.  The Bovie  electrocautery was placed in the shoulder, and a release of the biceps  tendon was performed.  The remainder of the stump was debrided back to  superior labral tissue.  The remainder of the labrum was stable.  The  anterior inferior labrum was stable.  The glenohumeral articular cartilage  appeared normal.  There was a large rotator cuff tear visualized.  The  subscapularis was visualized and noted to be in the normal location.  No  intra-articular loose bodies were visualized.  At this point the scope was  completed.  A sabre incision was created overlying the distal acromion in  line with Langer's skin lines.  This was done using a 10 blade scalpel after  preinfiltration of the skin with 0.5% Marcaine  with epinephrine.  Dissection  was carried down sharply through the subcutaneous tissues using Bovie  electrocautery.  The deltotrapezial fascia was identified and incised in the  line of the distal clavicle out over the lateral acromion and extending into  the raphe between the anterior and middle heads of the deltoid.  The  anterior deltoid was reflected off the anterior acromion.  A formal open  acromioplasty was performed utilizing oscillating saw, creating a type 1 or  flat acromial surface.  A rasp was used to rasp the rough edges.  The distal  clavicle excision was performed using an oscillating saw.  Bone wax was  applied to the distal end of the clavicle.  Adequate decompression was  confirmed by placing the operating surgeon's finger in the [facility] interval and  adducting it cross-arm, adducting the shoulder.  At this point a blunt  retractor was placed in the shoulder.  Formal bursectomy was performed.  A U-  shaped rotator cuff tear was identified.  The cuff was mobilized by  developing the rotator interval down through the coracoid process.  A Cobb  elevator was used both on the bursal surface and the joint side to release  the cuff from the superior labrum and capsule as well as releasing the cuff  from the adhesions developing on the bursal side of the rotator cuff.  Excellent mobilization and balance was achieved.  Fibrewire #2 sutures were  then placed in modified Mason-Allen technique.  Two such sutures were placed  into the free edge of the tendon.  Two 5-0 Vicryl suture anchors were placed  at the articular margin after removing soft tissue from the greater  tuberosity-rotator cuff insertion footprint area.  Prior to repairing the  cuff back down, a single 3.5 Panalok anchor was placed in the bicipital  groove to perform a bicipital tenodesis using a suture anchor technique.  Once the biceps was sutured with the elbow at neutral in midposition, the remainder of the soft  tissues was closed back over the biceps tendon,  creating a nice, smooth repair.  The remainder of the biceps tendon was  removed using a knife.  At this point the rotator cuff was repaired back  anatomically to its footprint utilizing a combination of bone tunnels  laterally as well as suture anchors medially, thus recreating the footprint.  The rotator interval was re-closed using available Panacryl suture in a  figure-of-eight interrupted technique.  An excellent soft tissue repair was  noted.  The shoulder was taken through full range of motion, the repair was  not under tension, and there was no  bony impingement with the acromion.  At  this point drill holes were placed in the anterior acromion to facilitate  repair of the anterior deltoid.  A full-thickness soft tissue repair was  performed using 0 Ethibond suture in a figure-of-eight buried fashion.  Once  the deltoid was repaired, the wound was again irrigated using antibiotic  irrigation, subcu closure with 2-0 Vicryl, and 4-0 Monocryl for the skin.  A  sterile dressing was applied, followed by a shoulder immobilizer.  The  patient tolerated the surgery well and was awakened and taken to the  recovery room in stable condition.                                                 Almedia Balls. Ranell Patrick, M.D.    SRN/MEDQ  D:  11/11/2001  T:  11/11/2001  Job:  [id number]
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
## 2010-06-01 NOTE — Cardiovascular Report (Signed)
[name], [name]                         ACCOUNT NO.:  [id number]   MEDICAL RECORD NO.:  [id number]                   PATIENT TYPE:  INP   LOCATION:  2025                                 FACILITY:  [facility]   PHYSICIAN:  Salvadore Farber, M.D.             DATE OF BIRTH:  1950/08/21   DATE OF PROCEDURE:  08/05/2002  DATE OF DISCHARGE:                              CARDIAC CATHETERIZATION   PROCEDURE:  Left heart catheterization, left ventriculography, coronary  angiography.   INDICATIONS:  The patient is a 60 year old gentleman with hypertension,  obstructive sleep apnea, morbid obesity, dyslipidemia, asthma, hypertension,  status post recent pacemaker, and non-obstructive coronary disease  documented at catheterization in 2003.  He presents with chest discomfort  occurring at rest.  He states that the discomfort was not substantially  different than the discomfort that has troubled him for the past 10 years  and led to a prior catheterization.  However, he was more concerned given  recent pacemaker insertion that this was in some way related.  Based on his  known disease he was referred by Rollene Rotunda, M.D. for coronary  angiography to exclude progression of disease as the etiology for this chest  discomfort.   PROCEDURAL TECHNIQUE:  Informed consent was obtained.  Under 1% lidocaine  local anesthesia a 6-French sheath was placed in the right femoral artery  using the modified Seldinger technique.  Diagnostic angiography was  performed using JL4 and JR4.  Ventriculogram was performed in the RAO  projection using a pigtail catheter.  The patient tolerated the procedure  well and was transferred to the holding room in stable condition.   COMPLICATIONS:  None.   FINDINGS:  1. LV 160/19/33.  EF 60% without regional wall motion abnormality.  2. Left main:  Angiographically normal.  3. LAD:  The LAD is a moderate sized vessel giving rise to a single diagonal     branch.   There is a 30% stenosis of the mid LAD after the takeoff of the     diagonal.  4. Circumflex:  The circumflex is a moderate sized vessel giving rise to a     large first and smaller second obtuse marginal.  The first marginal has a     60-70% stenosis proximally.  It is perhaps slightly worse than one year     ago.  5. RCA:  The RCA is a large, dominant vessel.  There is a 40% stenosis at     the distal RCA just prior to the takeoff of the PDA and PLV.   IMPRESSION/RECOMMENDATIONS:  The patient has a moderate obtuse marginal 1  lesion that is perhaps slightly worse compared with one year ago.  Will plan  Adenosine Cardiolite to exclude ischemia.  I think that the pain is most  likely noncardiac in etiology.   The patient does have markedly elevated left ventricular end-diastolic  pressure.  Will increase  hydrochlorothiazide dose.                                               Salvadore Farber, M.D.    WED/MEDQ  D:  08/05/2002  T:  08/05/2002  Job:  [id number]  Fanny Dance. Rankins, M.D.  [address]  [place]  [place] [address]  Fax: [phone]   Doylene Canning. Ladona Ridgel, M.D.   Clinton D. Young, M.D.  [address] [place]  [place] [address]  Fax: [phone]   cc:   Fanny Dance. Rankins, M.D.  [address]  [place]  [place] [address]  Fax: [phone]   Doylene Canning. Ladona Ridgel, M.D.   Clinton D. Young, M.D.  [address] [place]  [place] [address]  Fax: [phone]

## 2010-06-01 NOTE — Discharge Summary (Signed)
[name], [name]                         ACCOUNT NO.:  [id number]   MEDICAL RECORD NO.:  [id number]                   PATIENT TYPE:  INP   LOCATION:  2025                                 FACILITY:  [facility]   PHYSICIAN:  Rollene Rotunda, M.D.                DATE OF BIRTH:  Oct 28, 1950   DATE OF ADMISSION:  08/04/2002  DATE OF DISCHARGE:  08/06/2002                           DISCHARGE SUMMARY - REFERRING   HISTORY:  [name] is a 60 year old male who presented with three-day  history of intermittent chest discomfort relieved with sublingual  nitroglycerin, associated with diaphoresis and nausea. She has a known  history of nonobstructive coronary artery disease by catheterization in  2003.  This showed a 25% LAD, 30% ostial diagonal, 60% OM-1, 25% PDA.  She  had negative Cardiolite at that time with normal ejection fraction.  She  also has a history of bradycardia and is status post pacemaker, syncope,  sleep apnea, bronchospasm, hypertension, duodenal ulcer, arthritis, morbid  obesity, hyperlipidemia, right shoulder surgery, carpals tunnel surgery, and  appendectomy.   LABORATORY DATA:  TSH 2.078.  ER markers for myocardial infarction were  negative.  Total cholesterol was elevated to 69, triglycerides 232, HDL 43,  LDL 180. Sodium 138, potassium3.8, BUN 16, creatinine 0.8.  Subsequent  chemistries were unremarkable.  PT 12.7, PTT 31.  Hemoglobin 14.5,  hematocrit 41.9, normal indices, platelets 305, WBC 12.1.   Chest x-ray showed cardiomegaly without edema or consolidation.   EKG showed normal sinus rhythm, insignificant inferior Q waves, early R,  nonspecific ST wave changes.   HOSPITAL COURSE:  [name] was admitted to 2925 and placed on IV heparin  per pharmacy. Cardiac catheterization performed on July 22 by Dr. Samule Ohm  showed a 40% distal RCA, 30T mid LAD, and a 60 to 70% OM-1.  Dr. Samule Ohm  noted that she had moderate OM-1 lesion without significant change since  2003 while checking adenosine Cardiolite to exclude ischemia.  Adenosine  Cardiolite was performed on July 23, and thus she was discharged home.   DISCHARGE DIAGNOSES:  1. Chest discomfort of uncertain etiology.  2. Nonobstructive coronary artery disease.  3. Hyperlipidemia.  4. Tobacco use.  5. History as previously.   DISPOSITION:  1. She is asked to discontinue Lescol and substitute Crestor 10 mg q.h.s.  2. She was asked to continue her home medications which include:omeprazole     40 mg q.h.s.  3. Benazepril 20 mg daily.  4. Zoloft 200 mg daily.  5. HCTZ 12.5 daily.  6. Aspirin 81 daily.  7. Bextra 200 mg b.i.d.  8. Celebrex 100 b.i.d.  9. Trental 50 mg as needed.  10.      Advair 250/50 one puff b.i.d.  11.      Combivent 2 puffs b.i.d.  12.      Albuterol 2 puffs daily.  13.      Nasonex 50 mg  2 puffs b.i.d.  14.      Nasacort 2 puffs b.i.d.  15.      Pulmicort 2 puffs b.i.d.  16.      Allegra 60 mg b.i.d.   It is noted at time of discharge that it is not clear why she is on both  Pulmicort and Advair.   DISCHARGE INSTRUCTIONS:  1. She is advised no lifting, driving, sexual activity, or heavy exertion     for two days.  2. Maintain low-fat, low-salt, low-cholesterol diet.  3. If she had any problems with he catheterization site, she was asked to     call.  4. She was advised no smoking or tobacco products.  5. She will see Dr. Walden Field. in the office on August 10 at 12 p.m.  6. She will arrange a followup appointment with Dr. Maple Hudson and Dr. Luciana Axe.      Joellyn Rued, P.A. [facility]                    Rollene Rotunda, M.D.    EW/MEDQ  D:  08/31/2002  T:  09/01/2002  Job:  [id number]   cc:   Doylene Canning. Ladona Ridgel, M.D.   Dr. Maple Hudson   Dr. Luciana Axe

## 2013-06-14 DIAGNOSIS — M542 Cervicalgia: Secondary | ICD-10-CM

## 2013-06-14 HISTORY — DX: Cervicalgia: M54.2

## 2013-07-01 ENCOUNTER — Encounter (HOSPITAL_COMMUNITY): Payer: Self-pay | Admitting: Emergency Medicine

## 2013-07-01 ENCOUNTER — Emergency Department (HOSPITAL_COMMUNITY): Payer: Medicaid Other

## 2013-07-01 ENCOUNTER — Emergency Department (HOSPITAL_COMMUNITY)
Admission: EM | Admit: 2013-07-01 | Discharge: 2013-07-01 | Disposition: A | Discharge status: Home / Self Care | Payer: Medicaid Other | Attending: Emergency Medicine | Admitting: Emergency Medicine

## 2013-07-01 DIAGNOSIS — F17200 Nicotine dependence, unspecified, uncomplicated: Secondary | ICD-10-CM | POA: Diagnosis not present

## 2013-07-01 DIAGNOSIS — M542 Cervicalgia: Secondary | ICD-10-CM | POA: Diagnosis present

## 2013-07-01 DIAGNOSIS — M25519 Pain in unspecified shoulder: Secondary | ICD-10-CM | POA: Insufficient documentation

## 2013-07-01 DIAGNOSIS — M5412 Radiculopathy, cervical region: Secondary | ICD-10-CM

## 2013-07-01 DIAGNOSIS — M4712 Other spondylosis with myelopathy, cervical region: Secondary | ICD-10-CM | POA: Diagnosis not present

## 2013-07-01 DIAGNOSIS — R079 Chest pain, unspecified: Secondary | ICD-10-CM

## 2013-07-01 DIAGNOSIS — M47812 Spondylosis without myelopathy or radiculopathy, cervical region: Secondary | ICD-10-CM

## 2013-07-01 HISTORY — DX: Chest pain, unspecified: R07.9

## 2013-07-01 LAB — BASIC METABOLIC PANEL
BUN: 12 mg/dL (ref 6–23)
CALCIUM: 9.8 mg/dL (ref 8.4–10.5)
CO2: 23 mEq/L (ref 19–32)
Chloride: 101 mEq/L (ref 96–112)
Creatinine, Ser: 0.82 mg/dL (ref 0.50–1.35)
GFR calc Af Amer: >90 mL/min (ref >90)
Glucose, Bld: 103 mg/dL — ABNORMAL HIGH (ref 70–99)
Potassium: 3.9 mEq/L (ref 3.7–5.3)
SODIUM: 137 meq/L (ref 137–147)

## 2013-07-01 LAB — CBC WITH DIFFERENTIAL/PLATELET
BASOS ABS: 0 10*3/uL (ref 0.0–0.1)
Basophils Relative: 0 % (ref 0–1)
EOS ABS: 0 10*3/uL (ref 0.0–0.7)
EOS PCT: 0 % (ref 0–5)
HEMATOCRIT: 46.4 % (ref 39.0–52.0)
Hemoglobin: 15.5 g/dL (ref 13.0–17.0)
Lymphocytes Relative: 13 % (ref 12–46)
Lymphs Abs: 1.8 10*3/uL (ref 0.7–4.0)
MCH: 30 pg (ref 26.0–34.0)
MCHC: 33.4 g/dL (ref 30.0–36.0)
MCV: 89.9 fL (ref 78.0–100.0)
MONO ABS: 1.3 10*3/uL — AB (ref 0.1–1.0)
Monocytes Relative: 9 % (ref 3–12)
Neutro Abs: 10.9 10*3/uL — ABNORMAL HIGH (ref 1.7–7.7)
Neutrophils Relative %: 78 % — ABNORMAL HIGH (ref 43–77)
PLATELETS: 353 10*3/uL (ref 150–400)
RBC: 5.16 MIL/uL (ref 4.22–5.81)
RDW: 13 % (ref 11.5–15.5)
WBC: 14 10*3/uL — ABNORMAL HIGH (ref 4.0–10.5)

## 2013-07-01 LAB — I-STAT TROPONIN, ED: Troponin i, poc: 0 ng/mL (ref 0.00–0.08)

## 2013-07-01 MED ORDER — OXYCODONE-ACETAMINOPHEN 5-325 MG PO TABS
1.0000 | ORAL_TABLET | Freq: Once | ORAL | Status: AC
  Administered 2013-07-01: 1 via ORAL
  Filled 2013-07-01: qty 1

## 2013-07-01 MED ORDER — MORPHINE SULFATE 4 MG/ML IJ SOLN
4.0000 mg | Freq: Once | INTRAMUSCULAR | Status: AC
  Administered 2013-07-01: 4 mg via INTRAVENOUS
  Filled 2013-07-01: qty 1

## 2013-07-01 MED ORDER — OXYCODONE-ACETAMINOPHEN 5-325 MG PO TABS: 1.0000 | ORAL_TABLET | Freq: Four times a day (QID) | ORAL | Status: DC | PRN

## 2013-07-01 NOTE — Discharge Instructions (Signed)
Take pain medication as needed for pain.  Do not drive or operate heavy machinery for 4-6 hours after taking pain medication. °

## 2013-07-01 NOTE — ED Notes (Signed)
Per EMS, pt has hx of bone spurs in his neck. Pt states he has had worsening sharp, stabbing pain in the left side of his neck radiating down to his left hand. Pt states his hand feels "fat". Pt denies trauma. EMS gave pt 150mg  fentanyl. Pt states his pain was 11 out of 10 before receiving pain medication; pt now states his pain level is at a 3. Pt denies chest pain, states he felt nauseas until receiving fentanyl today.

## 2013-07-01 NOTE — ED Provider Notes (Signed)
Medical screening examination/treatment/procedure(s) were conducted as a shared visit with non-physician practitioner(s) and myself.  I personally evaluated the patient during the encounter.   EKG Interpretation   Date/Time:  Thursday July 01 2013 15:51:58 EDT Ventricular Rate:  88 PR Interval:  186 QRS Duration: 93 QT Interval:  354 QTC Calculation: 428 R Axis:   -2 Text Interpretation:  Sinus rhythm Anteroseptal infarct, old No  significant change was found Confirmed by [facility]  MD, TREY ([id number]) on  07/01/2013 3:56:4727 PM      63 year old male presenting with a primary complaint of left-sided neck pain. Present for several days. Worse with movement. Reports a history of "bone spurs" which he thinks may be causing his pain. On exam, not hot, not distressed, exquisite tenderness to left side, pain with range of motion of neck, normal strength in bilateral upper extremities.  Her heart sounds normal with regular rate and rhythm, lungs clear to auscultation bilaterally.  Patient also reported a brief episode of chest pain earlier today. He describes the pain is less severe than his typical angina. Resolved with nitroglycerin. Has experienced this pain in the past and reports that he would not come to the emergency department for this pain. He is here for evaluation of his neck.  Neck pain appears musculoskeletal in origin.  Clinical Impression: 1. DJD (degenerative joint disease), cervical   2. Cervical radiculopathy       Candyce ChurnJohn David Wofford III, MD 07/01/13 [phone]

## 2013-07-01 NOTE — ED Notes (Signed)
Bed: WA15 Expected date:  Expected time:  Means of arrival:  Comments: EMS  

## 2013-07-01 NOTE — ED Provider Notes (Signed)
CSN: [id number]     Arrival date & time 07/01/13  1425 History   First MD Initiated Contact with Patient 07/01/13 1519     Chief Complaint  Patient presents with  . Neck Pain  . Shoulder Pain     (Consider location/radiation/quality/duration/timing/severity/associated sxs/prior Treatment) HPI Comments: Patient presents today with a chief complaint of left sided neck pain.  He reports that the pain radiates down his left arm.  Pain associated with some numbness of his left arm.  He reports that the pain has been constant for the past 3 days.  He describes the pain as a "dull ache."  He states that he has taken Advil and his daughter's Flexeril without relief.  He states that the pain is worse with movement.  He denies any injury or trauma.  Denies any strenuous activity.  He states that he has never had pain like this before.  He reports that he also had some chest "discomfort" earlier today.  He states that he had this discomfort around 10 AM today and lasted until 11 AM.  Pain resolved with Nitroglycerin.  No chest discomfort since 11 AM.  He reports that this chest discomfort is similar to the typical angina that he gets once every couple of weeks.  However, he reports that the pain today was not as severe as his typical anginal pain.  He reports some mild SOB.  He denies fever or chills.  He currently smokes 1 ppd.  He denies any weakness, headache, dizziness, or vision changes.  He has a history of CAD and had a stent placed in 2009.  He is currently on Plavix.  He took his Plavix today.    The history is provided by the patient.    History reviewed. No pertinent past medical history. History reviewed. No pertinent past surgical history. No family history on file. History  Substance Use Topics  . Smoking status: Current Every Day Smoker -- 0.50 packs/day    Types: Cigarettes  . Smokeless tobacco: Not on file  . Alcohol Use: No    Review of Systems  All other systems reviewed and are  negative.     Allergies  Review of patient's allergies indicates no known allergies.  Home Medications   Prior to Admission medications   Medication Sig Start Date End Date Taking? Authorizing Provider  ibuprofen (ADVIL,MOTRIN) 200 MG tablet Take 400 mg by mouth every 6 (six) hours as needed for moderate pain.   Yes Historical Provider, MD   BP 148/77  Pulse 87  Temp(Src) 98.8 F (37.1 C) (Oral)  Resp 18  SpO2 98% Physical Exam  Nursing note and vitals reviewed. Constitutional: He appears well-developed and well-nourished.  HENT:  Head: Normocephalic and atraumatic.  Mouth/Throat: Oropharynx is clear and moist.  Neck: Normal range of motion. Neck supple.  Tenderness to palpation of the left neck.  Full ROM of the neck, but increased pain with movement,  Cardiovascular: Normal rate, regular rhythm and normal heart sounds.   Pulses:      Radial pulses are 2+ on the right side, and 2+ on the left side.  Pulmonary/Chest: Effort normal and breath sounds normal.  Musculoskeletal:       Cervical back: He exhibits tenderness and bony tenderness. He exhibits normal range of motion, no swelling and no edema.  Pain with ROM of the neck  Neurological: He is alert.  Sensation of both hands intact Grip strength 5/5 bilaterally  Skin: Skin is warm and dry.  Psychiatric: He has a normal mood and affect.    ED Course  Procedures (including critical care time) Labs Review Labs Reviewed  CBC WITH DIFFERENTIAL  BASIC METABOLIC PANEL  Rosezena SensorI-STAT TROPOININ, ED    Imaging Review Dg Chest 2 View  07/01/2013   CLINICAL DATA:  Chest pain.  Short of breath  EXAM: CHEST  2 VIEW  COMPARISON:  04/23/2006  FINDINGS: Cardiac silhouette is normal in size. Normal mediastinal and hilar contours.  Mildly prominent interstitial markings in the lung bases. Lungs are otherwise clear. No pleural effusion. No pneumothorax.  Left anterior chest wall sequential pacemaker is stable and well positioned.  Bony  thorax is intact.  IMPRESSION: No active cardiopulmonary disease.   Electronically Signed   By: Amie Portlandavid  Ormond M.D.   On: 07/01/2013 17:05   Dg Cervical Spine Complete  07/01/2013   CLINICAL DATA:  DJD.  EXAM: CERVICAL SPINE  4+ VIEWS  COMPARISON:  None.  FINDINGS: Diffuse cervical spine degenerative change noted. There is multilevel disc degeneration with vertebral endplate osteophyte formation diffuse facet hypertrophy. No high-grade bony neural foraminal narrowing. There is no evidence of fracture or dislocation. No acute bony abnormalities identified. Vascular calcifications consistent with carotid atherosclerotic vascular disease.  IMPRESSION: 1. Diffuse cervical spine DJD. No acute bony abnormality identified. 2. Carotid atherosclerotic vascular disease.   Electronically Signed   By: Maisie Fushomas  Register   On: 07/01/2013 17:08     EKG Interpretation   Date/Time:  Thursday July 01 2013 15:51:58 EDT Ventricular Rate:  88 PR Interval:  186 QRS Duration: 93 QT Interval:  354 QTC Calculation: 428 R Axis:   -2 Text Interpretation:  Sinus rhythm Anteroseptal infarct, old No  significant change was found Confirmed by [facility]  MD, TREY ([id number]) on  07/01/2013 3:56:27 PM      MDM   Final diagnoses:  None   Patient presenting with left sided neck pain.  He reports that the pain has been constant for the past 3 days.  Distal sensation of left hand intact.  Pain worse with movement.  Grip strength 5/5 bilaterally.  Suspect that the pain is musculoskeletal in origin.  Patient also reports chest discomfort earlier today.  However, he reports that the pain was less severe than his typical anginal pain.  No ischemic changes on his EKG.  Troponin negative.  CXR negative.  He reports that the reason he came to the ED was pain in his neck.  Feel that the patient is stable for discharge.  Return precautions given.     Santiago GladHeather Laisure, PA-C 07/03/13 [phone]

## 2013-07-04 NOTE — ED Provider Notes (Signed)
Medical screening examination/treatment/procedure(s) were conducted as a shared visit with non-physician practitioner(s) and myself.  I personally evaluated the patient during the encounter.   Please see my separate note.     Candyce ChurnJohn David Wofford III, MD 07/04/13 2051

## 2013-08-19 DIAGNOSIS — I482 Chronic atrial fibrillation, unspecified: Secondary | ICD-10-CM | POA: Insufficient documentation

## 2013-08-19 DIAGNOSIS — F319 Bipolar disorder, unspecified: Secondary | ICD-10-CM | POA: Insufficient documentation

## 2013-08-19 DIAGNOSIS — E781 Pure hyperglyceridemia: Secondary | ICD-10-CM | POA: Insufficient documentation

## 2013-08-19 DIAGNOSIS — F312 Bipolar disorder, current episode manic severe with psychotic features: Secondary | ICD-10-CM | POA: Insufficient documentation

## 2013-08-30 ENCOUNTER — Encounter: Payer: Self-pay | Admitting: Internal Medicine

## 2013-08-30 ENCOUNTER — Ambulatory Visit (INDEPENDENT_AMBULATORY_CARE_PROVIDER_SITE_OTHER): Payer: Medicaid Other | Admitting: Internal Medicine

## 2013-08-30 ENCOUNTER — Ambulatory Visit
Admission: RE | Admit: 2013-08-30 | Discharge: 2013-08-30 | Disposition: A | Discharge status: Home / Self Care | Payer: Medicaid Other | Source: Ambulatory Visit | Attending: Internal Medicine | Admitting: Internal Medicine

## 2013-08-30 VITALS — BP 177/96 | HR 71 | Ht 68.0 in | Wt 276.0 lb

## 2013-08-30 DIAGNOSIS — R0600 Dyspnea, unspecified: Secondary | ICD-10-CM

## 2013-08-30 DIAGNOSIS — R0609 Other forms of dyspnea: Secondary | ICD-10-CM

## 2013-08-30 DIAGNOSIS — R0989 Other specified symptoms and signs involving the circulatory and respiratory systems: Secondary | ICD-10-CM

## 2013-08-30 DIAGNOSIS — I10 Essential (primary) hypertension: Secondary | ICD-10-CM

## 2013-08-30 DIAGNOSIS — Z45010 Encounter for checking and testing of cardiac pacemaker pulse generator [battery]: Secondary | ICD-10-CM

## 2013-08-30 DIAGNOSIS — I441 Atrioventricular block, second degree: Secondary | ICD-10-CM

## 2013-08-30 DIAGNOSIS — Z45018 Encounter for adjustment and management of other part of cardiac pacemaker: Secondary | ICD-10-CM

## 2013-08-30 NOTE — Assessment & Plan Note (Signed)
His symptoms are class II. We will obtain a 2-D echo. If he has left ventricular dysfunction, we would anticipate placing a left ventricular lead at the time of pacemaker generator change out.

## 2013-08-30 NOTE — Assessment & Plan Note (Signed)
His blood pressure is elevated today that he did not take his medications. I've instructed the patient to take his medications as directed.

## 2013-08-30 NOTE — Patient Instructions (Addendum)
Your physician has requested that you have an echocardiogram. Echocardiography is a painless test that uses sound waves to create images of your heart. It provides your doctor with information about the size and shape of your heart and how well your heart's chambers and valves are working. This procedure takes approximately one hour. There are no restrictions for this procedure.  A chest x-ray takes a picture of the organs and structures inside the chest, including the heart, lungs, and blood vessels. This test can show several things, including, whether the heart is enlarges; whether fluid is building up in the lungs; and whether pacemaker / defibrillator leads are still in place.  You physician has recommended a pacemaker generator change.   Your physician recommends that you continue on your current medications as directed. Please refer to the Current Medication list given to you today.

## 2013-08-30 NOTE — Assessment & Plan Note (Signed)
His permanent [employer] pacemaker cannot be interrogated as he is past end-of-life. We will obtain a chest x-ray. We will schedule pacemaker generator change in the next few weeks.

## 2013-08-30 NOTE — Progress Notes (Signed)
HPI Mr. Nedeau returns today for followup. He is a very pleasant 63 year old man with a history of hypertension, symptomatic high-grade heart block, status post permanent pacemaker insertion. The patient moved to [place] several years ago, and ultimately was incarcerated. He is moved back to [place] and has resided here for approximately 3 months. While he has not had frank syncope, he does note dizziness, and shortness of breath. He has a history of chest pain but none recently. No peripheral edema. His blood pressure was elevated today but he did not take his medications this morning. No Known Allergies   Current Outpatient Prescriptions  Medication Sig Dispense Refill  . aspirin EC 81 MG tablet Take 81 mg by mouth daily.      Marland Kitchen atorvastatin (LIPITOR) 10 MG tablet Take 10 mg by mouth daily.      . divalproex (DEPAKOTE ER) 500 MG 24 hr tablet Take 500 mg by mouth daily.      Marland Kitchen lisinopril-hydrochlorothiazide (ZESTORETIC) 10-12.5 MG per tablet Take 1 tablet by mouth daily.      . nitroGLYCERIN (NITROSTAT) 0.4 MG SL tablet Place 0.4 mg under the tongue as needed.      . sertraline (ZOLOFT) 50 MG tablet Take 150 mg by mouth daily.       No current facility-administered medications for this visit.     Past Medical History  Diagnosis Date  . Cervical radiculopathy   . Asthma   . Cardiac pacemaker in situ 06/11/02    Dual chamber PM for symptomatic intermittent high-grade heart block with syncope  . Knee pain   . COPD (chronic obstructive pulmonary disease)   . CHF (congestive heart failure)   . DJD (degenerative joint disease), cervical   . Bone spur   . Chest pain 07/01/13    Went to ED  . Neck pain 06/2013    Left sided with pain radiating down left arm. Pain associated with numbness in left arm. Worse with movement.   . OSA (obstructive sleep apnea)   . Obesity   . CAD (coronary artery disease)   . Syncope   . Hypertension   . Hyperlipidemia     ROS:   All systems  reviewed and negative except as noted in the HPI.   Past Surgical History  Procedure Laterality Date  . Pacemaker insertion  06/11/02    Dual chamber PM for symptomatic intermittent high-grade heart block with syncope  . Shoulder arthroscopy Right 11/11/01  . Knee arthroscopy Left 06/17/05     Family History  Problem Relation Age of Onset  . Heart disease Mother   . Diabetes Mother      History   Social History  . Marital Status: Married    Spouse Name: N/A    Number of Children: N/A  . Years of Education: N/A   Occupational History  . Not on file.   Social History Main Topics  . Smoking status: Current Every Day Smoker -- 1.00 packs/day    Types: Cigarettes  . Smokeless tobacco: Not on file  . Alcohol Use: No  . Drug Use: No  . Sexual Activity: Not on file   Other Topics Concern  . Not on file   Social History Narrative  . No narrative on file     BP 177/96  Pulse 71  Ht  (1.727 m)  Wt 276 lb (125.193 kg)  BMI 41.98 kg/m2  Physical Exam:  Well appearing but diskempt 63 year old man, NAD  HEENT: Unremarkable Neck:  No JVD, no thyromegally Back:  No CVA tenderness Lungs:  Clear with no wheezes, rales, or rhonchi. HEART:  Regular rate rhythm, no murmurs, no rubs, no clicks Abd:  soft, positive bowel sounds, no organomegally, no rebound, no guarding Ext:  2 plus pulses, no edema, no cyanosis, no clubbing Skin:  No rashes no nodules Neuro:  CN II through XII intact, motor grossly intact   DEVICE  The patient has a dual-chamber St. Jude device which is past end-of-life and cannot be interrogated  Assess/Plan:

## 2013-09-06 ENCOUNTER — Ambulatory Visit (HOSPITAL_COMMUNITY): Payer: Medicaid Other | Attending: Cardiology | Admitting: Cardiology

## 2013-09-06 DIAGNOSIS — R0609 Other forms of dyspnea: Secondary | ICD-10-CM | POA: Diagnosis not present

## 2013-09-06 DIAGNOSIS — I441 Atrioventricular block, second degree: Secondary | ICD-10-CM

## 2013-09-06 DIAGNOSIS — R0600 Dyspnea, unspecified: Secondary | ICD-10-CM

## 2013-09-06 DIAGNOSIS — R0989 Other specified symptoms and signs involving the circulatory and respiratory systems: Principal | ICD-10-CM | POA: Insufficient documentation

## 2013-09-06 DIAGNOSIS — I10 Essential (primary) hypertension: Secondary | ICD-10-CM

## 2013-09-06 DIAGNOSIS — R0602 Shortness of breath: Secondary | ICD-10-CM

## 2013-09-06 NOTE — Progress Notes (Signed)
Echo performed. 

## 2013-09-16 DIAGNOSIS — M47812 Spondylosis without myelopathy or radiculopathy, cervical region: Secondary | ICD-10-CM | POA: Diagnosis not present

## 2013-09-16 DIAGNOSIS — I509 Heart failure, unspecified: Secondary | ICD-10-CM | POA: Diagnosis not present

## 2013-09-16 DIAGNOSIS — Z6841 Body Mass Index (BMI) 40.0 and over, adult: Secondary | ICD-10-CM | POA: Diagnosis not present

## 2013-09-16 DIAGNOSIS — I498 Other specified cardiac arrhythmias: Secondary | ICD-10-CM | POA: Diagnosis not present

## 2013-09-16 DIAGNOSIS — E785 Hyperlipidemia, unspecified: Secondary | ICD-10-CM | POA: Diagnosis not present

## 2013-09-16 DIAGNOSIS — Z45018 Encounter for adjustment and management of other part of cardiac pacemaker: Secondary | ICD-10-CM | POA: Diagnosis present

## 2013-09-16 DIAGNOSIS — G4733 Obstructive sleep apnea (adult) (pediatric): Secondary | ICD-10-CM | POA: Diagnosis not present

## 2013-09-16 DIAGNOSIS — E669 Obesity, unspecified: Secondary | ICD-10-CM | POA: Diagnosis not present

## 2013-09-16 DIAGNOSIS — Z7982 Long term (current) use of aspirin: Secondary | ICD-10-CM | POA: Diagnosis not present

## 2013-09-16 DIAGNOSIS — I442 Atrioventricular block, complete: Secondary | ICD-10-CM | POA: Diagnosis not present

## 2013-09-16 DIAGNOSIS — I10 Essential (primary) hypertension: Secondary | ICD-10-CM | POA: Diagnosis not present

## 2013-09-16 DIAGNOSIS — I2510 Atherosclerotic heart disease of native coronary artery without angina pectoris: Secondary | ICD-10-CM | POA: Diagnosis not present

## 2013-09-16 DIAGNOSIS — J45909 Unspecified asthma, uncomplicated: Secondary | ICD-10-CM | POA: Diagnosis not present

## 2013-09-16 MED ORDER — GENTAMICIN SULFATE 40 MG/ML IJ SOLN
80.0000 mg | INTRAMUSCULAR | Status: DC
  Filled 2013-09-16: qty 2

## 2013-09-16 MED ORDER — DEXTROSE 5 % IV SOLN
3.0000 g | INTRAVENOUS | Status: DC
  Filled 2013-09-16: qty 3000

## 2013-09-17 ENCOUNTER — Ambulatory Visit (HOSPITAL_COMMUNITY)
Admission: RE | Admit: 2013-09-17 | Discharge: 2013-09-17 | Disposition: A | Discharge status: Home / Self Care | Payer: Medicaid Other | Source: Ambulatory Visit | Attending: Internal Medicine | Admitting: Internal Medicine

## 2013-09-17 ENCOUNTER — Encounter (HOSPITAL_COMMUNITY)
Admission: RE | Disposition: A | Discharge status: Home / Self Care | Payer: Self-pay | Source: Ambulatory Visit | Attending: Internal Medicine

## 2013-09-17 DIAGNOSIS — Z45018 Encounter for adjustment and management of other part of cardiac pacemaker: Secondary | ICD-10-CM | POA: Diagnosis not present

## 2013-09-17 DIAGNOSIS — I442 Atrioventricular block, complete: Secondary | ICD-10-CM

## 2013-09-17 DIAGNOSIS — J45909 Unspecified asthma, uncomplicated: Secondary | ICD-10-CM | POA: Insufficient documentation

## 2013-09-17 DIAGNOSIS — I10 Essential (primary) hypertension: Secondary | ICD-10-CM | POA: Insufficient documentation

## 2013-09-17 DIAGNOSIS — I498 Other specified cardiac arrhythmias: Secondary | ICD-10-CM | POA: Insufficient documentation

## 2013-09-17 DIAGNOSIS — G4733 Obstructive sleep apnea (adult) (pediatric): Secondary | ICD-10-CM | POA: Insufficient documentation

## 2013-09-17 DIAGNOSIS — E785 Hyperlipidemia, unspecified: Secondary | ICD-10-CM | POA: Insufficient documentation

## 2013-09-17 DIAGNOSIS — Z7982 Long term (current) use of aspirin: Secondary | ICD-10-CM | POA: Insufficient documentation

## 2013-09-17 DIAGNOSIS — Z6841 Body Mass Index (BMI) 40.0 and over, adult: Secondary | ICD-10-CM | POA: Insufficient documentation

## 2013-09-17 DIAGNOSIS — E669 Obesity, unspecified: Secondary | ICD-10-CM | POA: Insufficient documentation

## 2013-09-17 DIAGNOSIS — I509 Heart failure, unspecified: Secondary | ICD-10-CM | POA: Insufficient documentation

## 2013-09-17 DIAGNOSIS — I2510 Atherosclerotic heart disease of native coronary artery without angina pectoris: Secondary | ICD-10-CM | POA: Insufficient documentation

## 2013-09-17 DIAGNOSIS — M47812 Spondylosis without myelopathy or radiculopathy, cervical region: Secondary | ICD-10-CM | POA: Insufficient documentation

## 2013-09-17 HISTORY — PX: PACEMAKER GENERATOR CHANGE: SHX5481

## 2013-09-17 HISTORY — PX: INSERT / REPLACE / REMOVE PACEMAKER: SUR710

## 2013-09-17 LAB — SURGICAL PCR SCREEN
MRSA, PCR: NEGATIVE
STAPHYLOCOCCUS AUREUS: NEGATIVE

## 2013-09-17 LAB — BASIC METABOLIC PANEL
Anion gap: 15 (ref 5–15)
BUN: 14 mg/dL (ref 6–23)
CO2: 21 mEq/L (ref 19–32)
CREATININE: 0.71 mg/dL (ref 0.50–1.35)
Calcium: 8.9 mg/dL (ref 8.4–10.5)
Chloride: 103 mEq/L (ref 96–112)
GFR calc non Af Amer: >90 mL/min (ref >90)
Glucose, Bld: 100 mg/dL — ABNORMAL HIGH (ref 70–99)
Potassium: 3.8 mEq/L (ref 3.7–5.3)
Sodium: 139 mEq/L (ref 137–147)

## 2013-09-17 LAB — CBC
HCT: 44.4 % (ref 39.0–52.0)
Hemoglobin: 15.3 g/dL (ref 13.0–17.0)
MCH: 29.8 pg (ref 26.0–34.0)
MCHC: 34.5 g/dL (ref 30.0–36.0)
MCV: 86.5 fL (ref 78.0–100.0)
Platelets: 295 10*3/uL (ref 150–400)
RBC: 5.13 MIL/uL (ref 4.22–5.81)
RDW: 14.1 % (ref 11.5–15.5)
WBC: 9.6 10*3/uL (ref 4.0–10.5)

## 2013-09-17 SURGERY — PACEMAKER GENERATOR CHANGE
Anesthesia: LOCAL

## 2013-09-17 MED ORDER — ACETAMINOPHEN 325 MG PO TABS: 325.0000 mg | ORAL_TABLET | ORAL | Status: DC | PRN

## 2013-09-17 MED ORDER — MUPIROCIN 2 % EX OINT
1.0000 "application " | TOPICAL_OINTMENT | Freq: Once | CUTANEOUS | Status: AC
  Administered 2013-09-17: 1 via TOPICAL

## 2013-09-17 MED ORDER — MUPIROCIN 2 % EX OINT
TOPICAL_OINTMENT | CUTANEOUS | Status: AC
  Administered 2013-09-17: 1 via TOPICAL
  Filled 2013-09-17: qty 22

## 2013-09-17 MED ORDER — ONDANSETRON HCL 4 MG/2ML IJ SOLN: 4.0000 mg | Freq: Four times a day (QID) | INTRAMUSCULAR | Status: DC | PRN

## 2013-09-17 MED ORDER — FENTANYL CITRATE 0.05 MG/ML IJ SOLN
INTRAMUSCULAR | Status: AC
  Filled 2013-09-17: qty 2

## 2013-09-17 MED ORDER — CHLORHEXIDINE GLUCONATE 4 % EX LIQD
60.0000 mL | Freq: Once | CUTANEOUS | Status: DC
  Filled 2013-09-17: qty 60

## 2013-09-17 MED ORDER — SODIUM CHLORIDE 0.9 % IV SOLN
INTRAVENOUS | Status: DC
  Administered 2013-09-17: 1000 mL via INTRAVENOUS

## 2013-09-17 MED ORDER — MIDAZOLAM HCL 5 MG/5ML IJ SOLN
INTRAMUSCULAR | Status: AC
  Filled 2013-09-17: qty 5

## 2013-09-17 MED ORDER — LIDOCAINE HCL (PF) 1 % IJ SOLN
INTRAMUSCULAR | Status: AC
  Filled 2013-09-17: qty 60

## 2013-09-17 NOTE — CV Procedure (Signed)
EP Procedure Note  Procedure:pacemaker removal and insertion of a new dual-chamber pacemaker  Indication: symptomatic bradycardia secondary to complete heart block, status post pacemaker insertion, with the old device at end-of-life  Description of the Procedure:after informed consent was obtained, the patient was prepped and draped in the usual manner. 30 cc of lidocaine was infiltrated into the left pectoral region. A 5 cm incision was carried out over the old pacemaker insertion and electrocautery was utilized to dissect down to the pacemaker pocket. The generator was removed with gentle traction. The leads were disconnected from the generator and evaluated. Both atrial and ventricular leads were found to be working satisfactorily. The P waves measure 2.4 mV and R waves measured 9.5 mV. The pacing threshold was 515 ohms in the atrium and 471 ohms in the ventricle. The pacing threshold in both the atrium and the ventricle was less than 1 V at 0.5 ms. Be satisfactory parameters, the new St. Jude dual-chamber pacemaker, serial number [id number] connected to the old pacing leads in place back in the subcutaneous pocket. The pocket was irrigated with antibiotic irrigation. The incision was closed with 2 layers of Vicryl suture. Benzoin and Steri-Strips pain on the skin, and the patient was returned to his room in satisfactory condition.  Complications: There were no immediate procedure complications  Conclusion: Successful removal of a previous implanted dual-chamber pacemaker which had reached elective replacement, and insertion of a new dual-chamber pacemaker in a patient with symptomatic bradycardia due to complete heart block.  Lewayne Bunting, M.D.

## 2013-09-17 NOTE — H&P (View-Only) (Signed)
HPI [name] returns today for followup. He is a very pleasant 63 year old man with a history of hypertension, symptomatic high-grade heart block, status post permanent pacemaker insertion. The patient moved to [place] several years ago, and ultimately was incarcerated. He is moved back to [place] and has resided here for approximately 3 months. While he has not had frank syncope, he does note dizziness, and shortness of breath. He has a history of chest pain but none recently. No peripheral edema. His blood pressure was elevated today but he did not take his medications this morning. No Known Allergies   Current Outpatient Prescriptions  Medication Sig Dispense Refill  . aspirin EC 81 MG tablet Take 81 mg by mouth daily.      Marland Kitchen atorvastatin (LIPITOR) 10 MG tablet Take 10 mg by mouth daily.      . divalproex (DEPAKOTE ER) 500 MG 24 hr tablet Take 500 mg by mouth daily.      Marland Kitchen lisinopril-hydrochlorothiazide (ZESTORETIC) 10-12.5 MG per tablet Take 1 tablet by mouth daily.      . nitroGLYCERIN (NITROSTAT) 0.4 MG SL tablet Place 0.4 mg under the tongue as needed.      . sertraline (ZOLOFT) 50 MG tablet Take 150 mg by mouth daily.       No current facility-administered medications for this visit.     Past Medical History  Diagnosis Date  . Cervical radiculopathy   . Asthma   . Cardiac pacemaker in situ 06/11/02    Dual chamber PM for symptomatic intermittent high-grade heart block with syncope  . Knee pain   . COPD (chronic obstructive pulmonary disease)   . CHF (congestive heart failure)   . DJD (degenerative joint disease), cervical   . Bone spur   . Chest pain 07/01/13    Went to ED  . Neck pain 06/2013    Left sided with pain radiating down left arm. Pain associated with numbness in left arm. Worse with movement.   . OSA (obstructive sleep apnea)   . Obesity   . CAD (coronary artery disease)   . Syncope   . Hypertension   . Hyperlipidemia     ROS:   All systems  reviewed and negative except as noted in the HPI.   Past Surgical History  Procedure Laterality Date  . Pacemaker insertion  06/11/02    Dual chamber PM for symptomatic intermittent high-grade heart block with syncope  . Shoulder arthroscopy Right 11/11/01  . Knee arthroscopy Left 06/17/05     Family History  Problem Relation Age of Onset  . Heart disease Mother   . Diabetes Mother      History   Social History  . Marital Status: Married    Spouse Name: N/A    Number of Children: N/A  . Years of Education: N/A   Occupational History  . Not on file.   Social History Main Topics  . Smoking status: Current Every Day Smoker -- 1.00 packs/day    Types: Cigarettes  . Smokeless tobacco: Not on file  . Alcohol Use: No  . Drug Use: No  . Sexual Activity: Not on file   Other Topics Concern  . Not on file   Social History Narrative  . No narrative on file     BP 177/96  Pulse 71  Ht  (1.727 m)  Wt 276 lb (125.193 kg)  BMI 41.98 kg/m2  Physical Exam:  Well appearing but diskempt 63 year old man, NAD  HEENT: Unremarkable Neck:  No JVD, no thyromegally Back:  No CVA tenderness Lungs:  Clear with no wheezes, rales, or rhonchi. HEART:  Regular rate rhythm, no murmurs, no rubs, no clicks Abd:  soft, positive bowel sounds, no organomegally, no rebound, no guarding Ext:  2 plus pulses, no edema, no cyanosis, no clubbing Skin:  No rashes no nodules Neuro:  CN II through XII intact, motor grossly intact   DEVICE  The patient has a dual-chamber St. Jude device which is past end-of-life and cannot be interrogated  Assess/Plan:

## 2013-09-17 NOTE — Interval H&P Note (Signed)
History and Physical Interval Note:  09/17/2013 7:54 AM  [name]  has presented today for surgery, with the diagnosis of Gen eol  The various methods of treatment have been discussed with the patient and family. After consideration of risks, benefits and other options for treatment, the patient has consented to  Procedure(s): PACEMAKER GENERATOR CHANGE (N/A) as a surgical intervention .  The patient's history has been reviewed, patient examined, no change in status, stable for surgery.  I have reviewed the patient's chart and labs.  Questions were answered to the patient's satisfaction.     Lewayne Bunting

## 2013-09-17 NOTE — Discharge Instructions (Signed)
Pacemaker Battery Change, Care After °Refer to this sheet in the next few weeks. These instructions provide you with information on caring for yourself after your procedure. Your health care provider may also give you more specific instructions. Your treatment has been planned according to current medical practices, but problems sometimes occur. Call your health care provider if you have any problems or questions after your procedure. °WHAT TO EXPECT AFTER THE PROCEDURE °After your procedure, it is typical to have the following sensations: °· Soreness at the pacemaker site. °HOME CARE INSTRUCTIONS  °· Keep the incision clean and dry. °· Unless advised otherwise, you may shower beginning 48 hours after your procedure. °· For the first week after the replacement, avoid stretching motions that pull at the incision site, and avoid heavy exercise with the arm that is on the same side as the incision. °· Take medicines only as directed by your health care provider. °· Keep all follow-up visits as directed by your health care provider. °SEEK MEDICAL CARE IF:  °· You have pain at the incision site that is not relieved by over-the-counter or prescription medicine. °· There is drainage or pus from the incision site. °· There is swelling larger than a lime at the incision site. °· You develop red streaking that extends above or below the incision site. °· You feel brief, intermittent palpitations, light-headedness, or any symptoms that you feel might be related to your heart. °SEEK IMMEDIATE MEDICAL CARE IF:  °· You experience chest pain that is different than the pain at the pacemaker site. °· You experience shortness of breath. °· You have palpitations or irregular heartbeat. °· You have light-headedness that does not go away quickly. °· You faint. °· You have pain that gets worse and is not relieved by medicine. °Document Released: 10/21/2012 Document Revised: 05/17/2013 Document Reviewed: 10/21/2012 °Exit[employer]® Patient  Information ©2015 [employer]. This information is not intended to replace advice given to you by your health care provider. Make sure you discuss any questions you have with your health care provider. ° °

## 2013-09-29 ENCOUNTER — Ambulatory Visit: Payer: Medicaid Other

## 2013-09-30 ENCOUNTER — Ambulatory Visit (INDEPENDENT_AMBULATORY_CARE_PROVIDER_SITE_OTHER): Payer: Medicaid Other | Admitting: *Deleted

## 2013-09-30 DIAGNOSIS — I441 Atrioventricular block, second degree: Secondary | ICD-10-CM

## 2013-09-30 MED ORDER — NITROGLYCERIN 0.4 MG SL SUBL: 0.4000 mg | SUBLINGUAL_TABLET | SUBLINGUAL | Status: DC | PRN

## 2013-10-01 LAB — MDC_IDC_ENUM_SESS_TYPE_INCLINIC
Battery Voltage: 3.11 V
Brady Statistic RA Percent Paced: 0.08 %
Lead Channel Impedance Value: 437.5 Ohm
Lead Channel Impedance Value: 512.5 Ohm
Lead Channel Pacing Threshold Pulse Width: 0.5 ms
Lead Channel Sensing Intrinsic Amplitude: 12 mV
Lead Channel Setting Pacing Amplitude: 1.125
Lead Channel Setting Pacing Amplitude: 1.75 V
Lead Channel Setting Sensing Sensitivity: 2 mV
MDC IDC MSMT BATTERY REMAINING LONGEVITY: 145.2 mo
MDC IDC MSMT LEADCHNL RA PACING THRESHOLD AMPLITUDE: 0.75 V
MDC IDC MSMT LEADCHNL RA PACING THRESHOLD PULSEWIDTH: 0.5 ms
MDC IDC MSMT LEADCHNL RA SENSING INTR AMPL: 3.5 mV
MDC IDC MSMT LEADCHNL RV PACING THRESHOLD AMPLITUDE: 0.875 V
MDC IDC PG SERIAL: 7669661
MDC IDC SESS DTM: 20150917131656
MDC IDC SET LEADCHNL RV PACING PULSEWIDTH: 0.5 ms
MDC IDC STAT BRADY RV PERCENT PACED: 0.48 %

## 2013-10-01 NOTE — Progress Notes (Signed)
Wound check appointment. Steri-strips removed by pt prior to ov (7 days after implant). Wound without redness or edema. Incision edges approximated, wound well healed. Stitch removed from incision site. Normal device function. Thresholds, sensing, and impedances consistent with implant measurements. Auto capture programmed on. Histogram distribution appropriate for patient and level of activity. 1 mode switch (<1%) x 2 sec @ 152/70---AT. No high ventricular rates noted. Patient educated about wound care, arm mobility, lifting restrictions. ROV in 3 months with GT. 

## 2013-10-05 ENCOUNTER — Encounter: Payer: Self-pay | Admitting: Internal Medicine

## 2013-11-02 ENCOUNTER — Telehealth: Payer: Self-pay | Admitting: Internal Medicine

## 2013-11-02 NOTE — Telephone Encounter (Signed)
New Message     Pt calling and is very irate. Pt is suppose to have knee surgery and can't schedule surgery until Dr. [name] at [facility] [place] receives surgical clearance from Dr. Ladona Ridgelaylor. Pt states this information was requested 2 months ago and wants to know who he needs to talk to in order for this to get done. Was seen by other doctor today and was told they still don't have his clearance. Please call pt and advise.

## 2013-11-03 NOTE — Telephone Encounter (Signed)
Per Dr Ladona Ridgelaylor okay to proceed with surgery.  Low risk from a cardiac standpoint.

## 2013-11-11 ENCOUNTER — Telehealth: Payer: Self-pay | Admitting: Internal Medicine

## 2013-11-11 NOTE — Telephone Encounter (Signed)
Follow up     Please fax clearance for knee surgery to [phone]---[facility].  They never received it.

## 2013-11-11 NOTE — Telephone Encounter (Signed)
Will re-fax today

## 2013-12-21 ENCOUNTER — Encounter: Payer: Medicaid Other | Admitting: Internal Medicine

## 2013-12-23 ENCOUNTER — Encounter (HOSPITAL_COMMUNITY): Payer: Self-pay | Admitting: Internal Medicine

## 2014-01-10 NOTE — Pre-Procedure Instructions (Addendum)
[name]  01/10/2014   Your procedure is scheduled on:  Friday, January 8.  Report to [facility] Admitting at 5:30 AM.  Call this number if you have problems the morning of surgery: [phone]              For any other questions, please call [phone], Monday - Friday 8 AM - 4 PM.  Remember:   Do not eat food or drink liquids after midnight Thursday, January 7.   Take these medicines the morning of surgery with A SIP OF WATER: divalproex (DEPAKOTE ER), sertraline (ZOLOFT).  May use Albuterol inhale if needed, please bring it with you on the day of surgery.   Do not wear jewelry, make-up or nail polish.  Do not wear lotions, powders, or perfumes.   Men may shave face and neck.  Do not bring valuables to the hospital.              [facility] is not responsible  for any belongings or valuables.               Contacts, dentures or bridgework may not be worn into surgery.  Leave suitcase in the car. After surgery it may be brought to your room.  For patients admitted to the hospital, discharge time is determined by your treatment team.               Patients discharged the day of surgery will not be allowed to drive home.  Name and phone number of your driver:    Special Instructions: [place] - Preparing for Surgery  Before surgery, you can play an important role.  Because skin is not sterile, your skin needs to be as free of germs as possible.  You can reduce the number of germs on you skin by washing with CHG (chlorahexidine gluconate) soap before surgery.  CHG is an antiseptic cleaner which kills germs and bonds with the skin to continue killing germs even after washing.  Please DO NOT use if you have an allergy to CHG or antibacterial soaps.  If your skin becomes reddened/irritated stop using the CHG and inform your nurse when you arrive at Short Stay.  Do not shave (including legs and underarms) for at least 48 hours prior to the first CHG shower.  You  may shave your face.  Please follow these instructions carefully:   1.  Shower with CHG Soap the night before surgery and the                                morning of Surgery.  2.  If you choose to wash your hair, wash your hair first as usual with your       normal shampoo.  3.  After you shampoo, rinse your hair and body thoroughly to remove the                      Shampoo.  4.  Use CHG as you would any other liquid soap.  You can apply chg directly       to the skin and wash gently with scrungie or a clean washcloth.  5.  Apply the CHG Soap to your body ONLY FROM THE NECK DOWN.        Do not use on open wounds or open sores.  Avoid contact with your eyes,  ears, mouth and genitals (private parts).  Wash genitals (private parts)       with your normal soap.  6.  Wash thoroughly, paying special attention to the area where your surgery        will be performed.  7.  Thoroughly rinse your body with warm water from the neck down.  8.  DO NOT shower/wash with your normal soap after using and rinsing off       the CHG Soap.  9.  Pat yourself dry with a clean towel.            10.  Wear clean pajamas.            11.  Place clean sheets on your bed the night of your first shower and do not        sleep with pets.  Day of Surgery  Do not apply any lotions/deoderants the morning of surgery.  Please wear clean clothes to the hospital/surgery center.      Please read over the following fact sheets that you were given: Pain Booklet, Coughing and Deep Breathing and Surgical Site Infection Prevention

## 2014-01-11 ENCOUNTER — Encounter (HOSPITAL_COMMUNITY)
Admission: RE | Admit: 2014-01-11 | Discharge: 2014-01-11 | Disposition: A | Discharge status: Home / Self Care | Payer: Medicaid Other | Source: Ambulatory Visit | Attending: Orthopedic Surgery | Admitting: Orthopedic Surgery

## 2014-01-11 ENCOUNTER — Encounter (HOSPITAL_COMMUNITY): Payer: Self-pay

## 2014-01-11 DIAGNOSIS — Z01818 Encounter for other preprocedural examination: Secondary | ICD-10-CM | POA: Diagnosis not present

## 2014-01-11 DIAGNOSIS — I509 Heart failure, unspecified: Secondary | ICD-10-CM | POA: Insufficient documentation

## 2014-01-11 DIAGNOSIS — I350 Nonrheumatic aortic (valve) stenosis: Secondary | ICD-10-CM | POA: Diagnosis not present

## 2014-01-11 DIAGNOSIS — F419 Anxiety disorder, unspecified: Secondary | ICD-10-CM | POA: Insufficient documentation

## 2014-01-11 DIAGNOSIS — E785 Hyperlipidemia, unspecified: Secondary | ICD-10-CM | POA: Diagnosis not present

## 2014-01-11 DIAGNOSIS — F17210 Nicotine dependence, cigarettes, uncomplicated: Secondary | ICD-10-CM | POA: Insufficient documentation

## 2014-01-11 DIAGNOSIS — J449 Chronic obstructive pulmonary disease, unspecified: Secondary | ICD-10-CM | POA: Diagnosis not present

## 2014-01-11 DIAGNOSIS — F319 Bipolar disorder, unspecified: Secondary | ICD-10-CM | POA: Insufficient documentation

## 2014-01-11 DIAGNOSIS — G4733 Obstructive sleep apnea (adult) (pediatric): Secondary | ICD-10-CM | POA: Diagnosis not present

## 2014-01-11 DIAGNOSIS — I351 Nonrheumatic aortic (valve) insufficiency: Secondary | ICD-10-CM | POA: Insufficient documentation

## 2014-01-11 DIAGNOSIS — I2510 Atherosclerotic heart disease of native coronary artery without angina pectoris: Secondary | ICD-10-CM | POA: Insufficient documentation

## 2014-01-11 DIAGNOSIS — J45909 Unspecified asthma, uncomplicated: Secondary | ICD-10-CM | POA: Diagnosis not present

## 2014-01-11 DIAGNOSIS — Z950 Presence of cardiac pacemaker: Secondary | ICD-10-CM | POA: Diagnosis not present

## 2014-01-11 DIAGNOSIS — F329 Major depressive disorder, single episode, unspecified: Secondary | ICD-10-CM | POA: Diagnosis not present

## 2014-01-11 DIAGNOSIS — I252 Old myocardial infarction: Secondary | ICD-10-CM | POA: Insufficient documentation

## 2014-01-11 DIAGNOSIS — I10 Essential (primary) hypertension: Secondary | ICD-10-CM | POA: Diagnosis not present

## 2014-01-11 HISTORY — DX: Depression, unspecified: F32.A

## 2014-01-11 HISTORY — DX: Acute myocardial infarction, unspecified: I21.9

## 2014-01-11 HISTORY — DX: Major depressive disorder, single episode, unspecified: F32.9

## 2014-01-11 HISTORY — DX: Bipolar disorder, unspecified: F31.9

## 2014-01-11 HISTORY — DX: Presence of cardiac pacemaker: Z95.0

## 2014-01-11 HISTORY — DX: Anxiety disorder, unspecified: F41.9

## 2014-01-11 HISTORY — DX: Reserved for inherently not codable concepts without codable children: IMO0001

## 2014-01-11 LAB — CBC
HCT: 47.6 % (ref 39.0–52.0)
Hemoglobin: 16.1 g/dL (ref 13.0–17.0)
MCH: 31 pg (ref 26.0–34.0)
MCHC: 33.8 g/dL (ref 30.0–36.0)
MCV: 91.7 fL (ref 78.0–100.0)
PLATELETS: 214 10*3/uL (ref 150–400)
RBC: 5.19 MIL/uL (ref 4.22–5.81)
RDW: 14.3 % (ref 11.5–15.5)
WBC: 7 10*3/uL (ref 4.0–10.5)

## 2014-01-11 LAB — APTT: APTT: 30 s (ref 24–37)

## 2014-01-11 LAB — BASIC METABOLIC PANEL
Anion gap: 9 (ref 5–15)
BUN: 17 mg/dL (ref 6–23)
CHLORIDE: 109 meq/L (ref 96–112)
CO2: 23 mmol/L (ref 19–32)
Calcium: 9.1 mg/dL (ref 8.4–10.5)
Creatinine, Ser: 0.83 mg/dL (ref 0.50–1.35)
GFR calc Af Amer: >90 mL/min (ref >90)
GFR calc non Af Amer: >90 mL/min (ref >90)
Glucose, Bld: 157 mg/dL — ABNORMAL HIGH (ref 70–99)
POTASSIUM: 3.8 mmol/L (ref 3.5–5.1)
Sodium: 141 mmol/L (ref 135–145)

## 2014-01-11 LAB — SURGICAL PCR SCREEN
MRSA, PCR: NEGATIVE
Staphylococcus aureus: NEGATIVE

## 2014-01-11 LAB — TYPE AND SCREEN
ABO/RH(D): A POS
Antibody Screen: NEGATIVE

## 2014-01-11 LAB — PROTIME-INR
INR: 1.01 (ref 0.00–1.49)
Prothrombin Time: 13.4 seconds (ref 11.6–15.2)

## 2014-01-11 LAB — ABO/RH: ABO/RH(D): A POS

## 2014-01-11 MED ORDER — CHLORHEXIDINE GLUCONATE 4 % EX LIQD: 60.0000 mL | Freq: Once | CUTANEOUS | Status: DC

## 2014-01-11 NOTE — H&P (Signed)
[name] is an 63 y.o. male.    Chief Complaint: left knee pain  HPI: Pt is a 63 y.o. male complaining of left knee pain for multiple years. Pain had continually increased since the beginning. X-rays in the clinic show end-stage arthritic changes of the left knee. Pt has tried various conservative treatments which have failed to alleviate their symptoms, including injections and therapy. Various options are discussed with the patient. Risks, benefits and expectations were discussed with the patient. Patient understand the risks, benefits and expectations and wishes to proceed with surgery.   PCP:  No primary care provider on file.  D/C Plans:  Home with HHPT  PMH: Past Medical History  Diagnosis Date  . Cervical radiculopathy   . Asthma   . Cardiac pacemaker in situ 06/11/02    Dual chamber PM for symptomatic intermittent high-grade heart block with syncope  . Knee pain   . COPD (chronic obstructive pulmonary disease)   . CHF (congestive heart failure)   . DJD (degenerative joint disease), cervical   . Bone spur   . Chest pain 07/01/13    Went to ED  . Neck pain 06/2013    Left sided with pain radiating down left arm. Pain associated with numbness in left arm. Worse with movement.   . OSA (obstructive sleep apnea)   . Obesity   . CAD (coronary artery disease)   . Syncope   . Hypertension   . Hyperlipidemia     PSH: Past Surgical History  Procedure Laterality Date  . Pacemaker insertion  06/11/02    Dual chamber PM for symptomatic intermittent high-grade heart block with syncope  . Shoulder arthroscopy Right 11/11/01  . Knee arthroscopy Left 06/17/05  . Pacemaker generator change N/A 09/17/2013    Procedure: PACEMAKER GENERATOR CHANGE;  Surgeon: Marinus MawGregg W Taylor, MD;  [facility] LAB;  Service: Cardiovascular;  Laterality: N/A;    Social History:  reports that he has been smoking Cigarettes.  He has been smoking about 1.00 pack per day. He does not have any smokeless  tobacco history on file. He reports that he does not drink alcohol or use illicit drugs.  Allergies:  No Known Allergies  Medications: No current facility-administered medications for this encounter.   Current Outpatient Prescriptions  Medication Sig Dispense Refill  . albuterol (PROVENTIL HFA;VENTOLIN HFA) 108 (90 BASE) MCG/ACT inhaler Inhale 2 puffs into the lungs every 6 (six) hours as needed for wheezing or shortness of breath.    Marland Kitchen. aspirin EC 81 MG tablet Take 81 mg by mouth daily.    Marland Kitchen. atorvastatin (LIPITOR) 10 MG tablet Take 10 mg by mouth daily.    . divalproex (DEPAKOTE ER) 500 MG 24 hr tablet Take 2,000 mg by mouth at bedtime.  1  . lisinopril-hydrochlorothiazide (ZESTORETIC) 10-12.5 MG per tablet Take 1 tablet by mouth daily.    . nitroGLYCERIN (NITROSTAT) 0.4 MG SL tablet Place 1 tablet (0.4 mg total) under the tongue every 5 (five) minutes as needed for chest pain. 25 tablet 3  . sertraline (ZOLOFT) 50 MG tablet Take 150 mg by mouth every morning.  1  . temazepam (RESTORIL) 15 MG capsule Take 15 mg by mouth at bedtime as needed for sleep.   2  . traMADol (ULTRAM) 50 MG tablet Take 50 mg by mouth every 6 (six) hours as needed for moderate pain or severe pain.    . traZODone (DESYREL) 50 MG tablet Take 25-200 mg by mouth at bedtime  as needed for sleep.   1    No results found for this or any previous visit (from the past 48 hour(s)). No results found.  ROS: Pain with rom of the left lower extremity  Physical Exam:  Alert and oriented 63 y.o. male in no acute distress Cranial nerves 2-12 intact Cervical spine: full rom with no tenderness, nv intact distally Chest: active breath sounds bilaterally, no wheeze rhonchi or rales Heart: regular rate and rhythm, no murmur Abd: non tender non distended with active bowel sounds Hip is stable with rom  Left knee with moderate crepitus with rom nv intact distally No rashes  Minimal edema bilateral lower legs Antalgic  gait  Assessment/Plan Assessment: left knee end stage osteoarthritis  Plan: Patient will undergo a left total knee arthroplasty by [facility] Ranell [facility]ris [facility]. Risks benefits and expectations were discussed with the patient. Patient understand risks, benefits and expectations and wishes to proceed.

## 2014-01-11 NOTE — Progress Notes (Signed)
Patient stated that at one time he was on Plavix , prescribed by a MD in [place]. Stated can't remember his name . Stated Plavix was stopped 3 months ago when script ran out,and  no refill.

## 2014-01-12 NOTE — Progress Notes (Signed)
Anesthesia Chart Review:  Patient is a 63 year old male scheduled for left TKA on 01/21/14 by Dr. Ranell [name].  History includes smoking, asthma, COPD,exertional dyspnea, CHF, chest pain 2003/2004 with mild-moderate non-obstructive CAD by 2004 cath, question of MI (although not listed in Dr. Lubertha [name]'s 08/2013 note), mild AS by 08/2013 echo, intermittent high grade HB with syncope s/p St. Jude dual chamber PPM in 2004 with generator change 09/2013, HTN, HLD, OSA without CPAP use, depression, anxiety, Bipolar disorder. BMI is consistent with morbid obesity. PCP is [name] HolidayAshley Michaels, PA-C with [facility]([place]; see Care Everywhere). Cardiologist is Dr. Lewayne BuntingGregg [name] who on 11/03/13 felt patient was low risk form a cardiac standpoint.  EKG on 07/01/13: SR, old anteroseptal infarct.  09/06/13 echo: - Left ventricle: The cavity size was normal. Wall thickness was increased in a pattern of mild LVH. Systolic function was normal. The estimated ejection fraction was in the range of 50% to 55%. Wall motion was normal; there were no regional wall motion abnormalities. Doppler parameters are consistent with abnormal left ventricular relaxation (grade 1 diastolic dysfunction). Doppler parameters are consistent with high ventricular filling pressure. - Aortic valve: There was mild stenosis. There was mild regurgitation. - Mitral valve: Calcified annulus. Impressions: Low normal LV function; grade 1 diastolic dysfunction; calcifiedaortic valve with mild AS and AI.  Last stress test and cardiac cath seen in Epic were from 2004; however by notes, he was in [place] for several years and ultimately incarcerated but moved back to [place] earlier this year.  08/30/13 CXR: No acute cardiopulmonary abnormality seen. Left sided dual lead PPM.  Preoperative labs noted.    Patient has been cleared for surgery by his cardiologist.  If no acute changes then I would anticipate that he could proceed as  planned.  [name] Ochsllison [name] Cauthon, PA-C [facility] Short Stay Center/Anesthesiology Phone [phone] 01/12/2014 11:29 AM

## 2014-01-20 ENCOUNTER — Encounter: Payer: Self-pay | Admitting: Internal Medicine

## 2014-01-20 ENCOUNTER — Ambulatory Visit (INDEPENDENT_AMBULATORY_CARE_PROVIDER_SITE_OTHER): Payer: Medicaid Other | Admitting: Internal Medicine

## 2014-01-20 VITALS — BP 132/68 | HR 76 | Ht 68.0 in | Wt 299.0 lb

## 2014-01-20 DIAGNOSIS — Z950 Presence of cardiac pacemaker: Secondary | ICD-10-CM

## 2014-01-20 DIAGNOSIS — I441 Atrioventricular block, second degree: Secondary | ICD-10-CM

## 2014-01-20 DIAGNOSIS — I10 Essential (primary) hypertension: Secondary | ICD-10-CM

## 2014-01-20 LAB — MDC_IDC_ENUM_SESS_TYPE_INCLINIC
Battery Remaining Longevity: 142.8 mo
Battery Voltage: 3.05 V
Brady Statistic RV Percent Paced: 1.4 %
Date Time Interrogation Session: 20160107100857
Implantable Pulse Generator Model: 2240
Lead Channel Pacing Threshold Amplitude: 0.75 V
Lead Channel Pacing Threshold Pulse Width: 0.5 ms
Lead Channel Sensing Intrinsic Amplitude: 11.1 mV
Lead Channel Sensing Intrinsic Amplitude: 4.9 mV
Lead Channel Setting Pacing Amplitude: 1.125
Lead Channel Setting Pacing Pulse Width: 0.5 ms
MDC IDC MSMT LEADCHNL RA IMPEDANCE VALUE: 487.5 Ohm
MDC IDC MSMT LEADCHNL RV IMPEDANCE VALUE: 437.5 Ohm
MDC IDC MSMT LEADCHNL RV PACING THRESHOLD AMPLITUDE: 0.875 V
MDC IDC MSMT LEADCHNL RV PACING THRESHOLD PULSEWIDTH: 0.5 ms
MDC IDC PG SERIAL: 7669661
MDC IDC SET LEADCHNL RA PACING AMPLITUDE: 1.75 V
MDC IDC SET LEADCHNL RV SENSING SENSITIVITY: 2 mV
MDC IDC STAT BRADY RA PERCENT PACED: 0.28 %

## 2014-01-20 MED ORDER — DEXTROSE 5 % IV SOLN
3.0000 g | INTRAVENOUS | Status: AC
  Administered 2014-01-21: 3 g via INTRAVENOUS
  Filled 2014-01-20: qty 3000

## 2014-01-20 NOTE — Assessment & Plan Note (Signed)
His St. Jude DDD PM is working normally. Will recheck in several months. 

## 2014-01-20 NOTE — Assessment & Plan Note (Signed)
I have strongly encouraged the patient to lose weight. He admits to dietary indiscretion.

## 2014-01-20 NOTE — Patient Instructions (Signed)
Your physician wants you to follow-up in: Sept 2016 with Dr. Court Joyaylor You will receive a reminder letter in the mail two months in advance. If you don't receive a letter, please call our office to schedule the follow-up appointment.  Remote monitoring is used to monitor your Pacemaker of ICD from home. This monitoring reduces the number of office visits required to check your device to one time per year. It allows us to keep an eye on the functioning of your device to ensure it is working properly. You are scheduled for a device check from home on 04/21/14. You may send your transmission at any time that day. If you have a wireless device, the transmission will be sent automatically. After your physician reviews your transmission, you will receive a postcard with your next transmission date.

## 2014-01-20 NOTE — Assessment & Plan Note (Signed)
His blood pressure is well controlled today. He will continue his current meds. I have asked him to reduce his salt intake.

## 2014-01-20 NOTE — Progress Notes (Signed)
HPI [name] returns today for followup. He is a very pleasant 64 year old man with a history of hypertension, symptomatic high-grade heart block, status post permanent pacemaker insertion. The patient moved to [place] several years ago, and ultimately was incarcerated. [place] back [place] and has resided here for over a year. He has a history of chest pain but none recently. No peripheral edema. His weight has gone up but he denies dietary indiscretion. He is pending knee surgery.  No Known Allergies   Current Outpatient Prescriptions  Medication Sig Dispense Refill  . albuterol (PROVENTIL HFA;VENTOLIN HFA) 108 (90 BASE) MCG/ACT inhaler Inhale 2 puffs into the lungs every 6 (six) hours as needed for wheezing or shortness of breath.    Marland Kitchen. aspirin EC 81 MG tablet Take 81 mg by mouth daily.    Marland Kitchen. atorvastatin (LIPITOR) 10 MG tablet Take 10 mg by mouth daily.    . divalproex (DEPAKOTE ER) 500 MG 24 hr tablet Take 2,000 mg by mouth at bedtime.  1  . lisinopril-hydrochlorothiazide (ZESTORETIC) 10-12.5 MG per tablet Take 1 tablet by mouth daily.    . nitroGLYCERIN (NITROSTAT) 0.4 MG SL tablet Place 1 tablet (0.4 mg total) under the tongue every 5 (five) minutes as needed for chest pain. 25 tablet 3  . sertraline (ZOLOFT) 50 MG tablet Take 150 mg by mouth every morning.  1  . temazepam (RESTORIL) 15 MG capsule Take 15 mg by mouth at bedtime as needed for sleep.   2  . traMADol (ULTRAM) 50 MG tablet Take 50 mg by mouth every 6 (six) hours as needed for moderate pain or severe pain.    . traZODone (DESYREL) 50 MG tablet Take 25-200 mg by mouth at bedtime as needed for sleep.   1   No current facility-administered medications for this visit.     Past Medical History  Diagnosis Date  . Cervical radiculopathy   . Asthma   . Cardiac pacemaker in situ 06/11/02    Dual chamber PM for symptomatic intermittent high-grade heart block with syncope  . Knee pain   . COPD (chronic obstructive  pulmonary disease)   . CHF (congestive heart failure)   . DJD (degenerative joint disease), cervical   . Bone spur   . Chest pain 07/01/13    Went to ED  . Neck pain 06/2013    Left sided with pain radiating down left arm. Pain associated with numbness in left arm. Worse with movement.   . Obesity   . CAD (coronary artery disease)   . Syncope   . Hypertension   . Hyperlipidemia   . Myocardial infarction   . Presence of permanent cardiac pacemaker     Dr Sharrell KuGreg Adriann Ballweg  . OSA (obstructive sleep apnea)     stopped using CPAP 2008  . Shortness of breath dyspnea     with exertion  . Depression   . Anxiety   . Bipolar disorder     ROS:   All systems reviewed and negative except as noted in the HPI.   Past Surgical History  Procedure Laterality Date  . Pacemaker insertion  06/11/02    Dual chamber PM for symptomatic intermittent high-grade heart block with syncope  . Shoulder arthroscopy Right 11/11/01  . Knee arthroscopy Left 06/17/05  . Pacemaker generator change N/A 09/17/2013    Procedure: PACEMAKER GENERATOR CHANGE;  Surgeon: Marinus MawGregg W Yvonne Stopher, MD;  [facility];  Service: Cardiovascular;  Laterality: N/A;  .  Appendectomy  1975  . Insert / replace / remove pacemaker  09/17/13     Family History  Problem Relation Age of Onset  . Heart disease Mother   . Diabetes Mother      History   Social History  . Marital Status: Married    Spouse Name: N/A    Number of Children: N/A  . Years of Education: N/A   Occupational History  . Not on file.   Social History Main Topics  . Smoking status: Current Every Day Smoker -- 1.00 packs/day for 42 years    Types: Cigarettes, Cigars  . Smokeless tobacco: Never Used  . Alcohol Use: No  . Drug Use: No  . Sexual Activity: Not on file   Other Topics Concern  . Not on file   Social History Narrative     BP 132/68 mmHg  Pulse 76  Ht  (1.727 m)  Wt 299 lb (135.626 kg)  BMI 45.47 kg/m2  Physical Exam:  Well  appearing but diskempt 64 year old man, NAD HEENT: Unremarkable Neck:  No JVD, no thyromegally Back:  No CVA tenderness Lungs:  Clear with no wheezes, rales, or rhonchi. HEART:  Regular rate rhythm, no murmurs, no rubs, no clicks Abd:  soft, positive bowel sounds, no organomegally, no rebound, no guarding Ext:  2 plus pulses, no edema, no cyanosis, no clubbing Skin:  No rashes no nodules Neuro:  CN II through XII intact, motor grossly intact   DEVICE  The patient has a dual-chamber [employer] device which is working [employer].  Assess/Plan:

## 2014-01-21 ENCOUNTER — Inpatient Hospital Stay (HOSPITAL_COMMUNITY): Payer: Medicaid Other | Admitting: Anesthesiology

## 2014-01-21 ENCOUNTER — Encounter (HOSPITAL_COMMUNITY)
Admission: RE | Disposition: A | Discharge status: Home / Self Care | Payer: Self-pay | Source: Ambulatory Visit | Attending: Orthopedic Surgery

## 2014-01-21 ENCOUNTER — Encounter: Payer: Self-pay | Admitting: Internal Medicine

## 2014-01-21 ENCOUNTER — Encounter (HOSPITAL_COMMUNITY): Payer: Self-pay | Admitting: *Deleted

## 2014-01-21 ENCOUNTER — Inpatient Hospital Stay (HOSPITAL_COMMUNITY): Payer: Medicaid Other

## 2014-01-21 ENCOUNTER — Inpatient Hospital Stay (HOSPITAL_COMMUNITY): Payer: Medicaid Other | Admitting: Vascular Surgery

## 2014-01-21 ENCOUNTER — Inpatient Hospital Stay (HOSPITAL_COMMUNITY)
Admission: RE | Admit: 2014-01-21 | Discharge: 2014-01-26 | DRG: 470 | Disposition: A | Discharge status: Home / Self Care | Payer: Medicaid Other | Source: Ambulatory Visit | Attending: Orthopedic Surgery | Admitting: Orthopedic Surgery

## 2014-01-21 DIAGNOSIS — E669 Obesity, unspecified: Secondary | ICD-10-CM | POA: Diagnosis present

## 2014-01-21 DIAGNOSIS — Z6841 Body Mass Index (BMI) 40.0 and over, adult: Secondary | ICD-10-CM

## 2014-01-21 DIAGNOSIS — Z950 Presence of cardiac pacemaker: Secondary | ICD-10-CM

## 2014-01-21 DIAGNOSIS — F17210 Nicotine dependence, cigarettes, uncomplicated: Secondary | ICD-10-CM | POA: Diagnosis present

## 2014-01-21 DIAGNOSIS — I2510 Atherosclerotic heart disease of native coronary artery without angina pectoris: Secondary | ICD-10-CM | POA: Diagnosis present

## 2014-01-21 DIAGNOSIS — M5412 Radiculopathy, cervical region: Secondary | ICD-10-CM | POA: Diagnosis present

## 2014-01-21 DIAGNOSIS — M1712 Unilateral primary osteoarthritis, left knee: Principal | ICD-10-CM | POA: Diagnosis present

## 2014-01-21 DIAGNOSIS — Z7982 Long term (current) use of aspirin: Secondary | ICD-10-CM

## 2014-01-21 DIAGNOSIS — I509 Heart failure, unspecified: Secondary | ICD-10-CM | POA: Diagnosis present

## 2014-01-21 DIAGNOSIS — G4733 Obstructive sleep apnea (adult) (pediatric): Secondary | ICD-10-CM | POA: Diagnosis present

## 2014-01-21 DIAGNOSIS — I10 Essential (primary) hypertension: Secondary | ICD-10-CM | POA: Diagnosis present

## 2014-01-21 DIAGNOSIS — E785 Hyperlipidemia, unspecified: Secondary | ICD-10-CM | POA: Diagnosis present

## 2014-01-21 DIAGNOSIS — M25562 Pain in left knee: Secondary | ICD-10-CM | POA: Diagnosis present

## 2014-01-21 DIAGNOSIS — J449 Chronic obstructive pulmonary disease, unspecified: Secondary | ICD-10-CM | POA: Diagnosis present

## 2014-01-21 DIAGNOSIS — Z96659 Presence of unspecified artificial knee joint: Secondary | ICD-10-CM

## 2014-01-21 HISTORY — PX: TOTAL KNEE ARTHROPLASTY: SHX125

## 2014-01-21 LAB — CBC
HCT: 48.4 % (ref 39.0–52.0)
Hemoglobin: 15.4 g/dL (ref 13.0–17.0)
MCH: 30.6 pg (ref 26.0–34.0)
MCHC: 31.8 g/dL (ref 30.0–36.0)
MCV: 96.2 fL (ref 78.0–100.0)
Platelets: 268 10*3/uL (ref 150–400)
RBC: 5.03 MIL/uL (ref 4.22–5.81)
RDW: 15.1 % (ref 11.5–15.5)
WBC: 18.4 10*3/uL — AB (ref 4.0–10.5)

## 2014-01-21 LAB — GLUCOSE, CAPILLARY: Glucose-Capillary: 136 mg/dL — ABNORMAL HIGH (ref 70–99)

## 2014-01-21 LAB — CREATININE, SERUM
CREATININE: 0.99 mg/dL (ref 0.50–1.35)
GFR calc Af Amer: >90 mL/min (ref >90)
GFR, EST NON AFRICAN AMERICAN: 86 mL/min — AB (ref >90)

## 2014-01-21 SURGERY — ARTHROPLASTY, KNEE, TOTAL
Anesthesia: Regional | Site: Knee | Laterality: Left

## 2014-01-21 MED ORDER — ATORVASTATIN CALCIUM 10 MG PO TABS
10.0000 mg | ORAL_TABLET | Freq: Every day | ORAL | Status: DC
  Administered 2014-01-21 – 2014-01-22 (×2): 10 mg via ORAL
  Filled 2014-01-21 (×2): qty 1

## 2014-01-21 MED ORDER — PATIENT'S GUIDE TO USING COUMADIN BOOK
Freq: Once | Status: AC
  Administered 2014-01-21: 13:00:00
  Filled 2014-01-21: qty 1

## 2014-01-21 MED ORDER — METOCLOPRAMIDE HCL 5 MG/ML IJ SOLN: 5.0000 mg | Freq: Three times a day (TID) | INTRAMUSCULAR | Status: DC | PRN

## 2014-01-21 MED ORDER — ASPIRIN EC 81 MG PO TBEC
81.0000 mg | DELAYED_RELEASE_TABLET | Freq: Every day | ORAL | Status: DC
  Administered 2014-01-21 – 2014-01-26 (×6): 81 mg via ORAL
  Filled 2014-01-21 (×6): qty 1

## 2014-01-21 MED ORDER — DIVALPROEX SODIUM ER 500 MG PO TB24
2000.0000 mg | ORAL_TABLET | Freq: Every day | ORAL | Status: DC
  Administered 2014-01-21 – 2014-01-25 (×5): 2000 mg via ORAL
  Filled 2014-01-21 (×6): qty 4

## 2014-01-21 MED ORDER — POLYETHYLENE GLYCOL 3350 17 G PO PACK
17.0000 g | PACK | Freq: Every day | ORAL | Status: DC | PRN
  Administered 2014-01-24 – 2014-01-25 (×2): 17 g via ORAL
  Filled 2014-01-21 (×2): qty 1

## 2014-01-21 MED ORDER — FERROUS SULFATE 325 (65 FE) MG PO TABS
325.0000 mg | ORAL_TABLET | Freq: Three times a day (TID) | ORAL | Status: DC
  Administered 2014-01-22 – 2014-01-26 (×14): 325 mg via ORAL
  Filled 2014-01-21 (×18): qty 1

## 2014-01-21 MED ORDER — WARFARIN - PHARMACIST DOSING INPATIENT
Freq: Every day | Status: DC
  Administered 2014-01-22 – 2014-01-23 (×2)

## 2014-01-21 MED ORDER — PROPOFOL 10 MG/ML IV BOLUS
INTRAVENOUS | Status: DC | PRN
  Administered 2014-01-21: 150 mg via INTRAVENOUS
  Administered 2014-01-21: 30 mg via INTRAVENOUS

## 2014-01-21 MED ORDER — EPHEDRINE SULFATE 50 MG/ML IJ SOLN
INTRAMUSCULAR | Status: DC | PRN
  Administered 2014-01-21: 10 mg via INTRAVENOUS
  Administered 2014-01-21: 5 mg via INTRAVENOUS

## 2014-01-21 MED ORDER — TEMAZEPAM 15 MG PO CAPS: 15.0000 mg | ORAL_CAPSULE | Freq: Every evening | ORAL | Status: DC | PRN

## 2014-01-21 MED ORDER — METHOCARBAMOL 1000 MG/10ML IJ SOLN
500.0000 mg | INTRAVENOUS | Status: AC
  Administered 2014-01-21: 500 mg via INTRAVENOUS
  Filled 2014-01-21: qty 5

## 2014-01-21 MED ORDER — HYDROMORPHONE HCL 1 MG/ML IJ SOLN
INTRAMUSCULAR | Status: AC
  Filled 2014-01-21: qty 1

## 2014-01-21 MED ORDER — HYDROMORPHONE HCL 1 MG/ML IJ SOLN
1.0000 mg | INTRAMUSCULAR | Status: DC | PRN
  Administered 2014-01-21 (×2): 1 mg via INTRAVENOUS
  Administered 2014-01-21: 2 mg via INTRAVENOUS
  Administered 2014-01-22 (×2): 1 mg via INTRAVENOUS
  Administered 2014-01-22 (×3): 2 mg via INTRAVENOUS
  Administered 2014-01-22: 1 mg via INTRAVENOUS
  Administered 2014-01-22 – 2014-01-23 (×6): 2 mg via INTRAVENOUS
  Filled 2014-01-21 (×2): qty 2
  Filled 2014-01-21: qty 1
  Filled 2014-01-21: qty 2
  Filled 2014-01-21: qty 1
  Filled 2014-01-21: qty 2
  Filled 2014-01-21: qty 1
  Filled 2014-01-21 (×6): qty 2
  Filled 2014-01-21: qty 1
  Filled 2014-01-21 (×2): qty 2

## 2014-01-21 MED ORDER — WARFARIN SODIUM 10 MG PO TABS
10.0000 mg | ORAL_TABLET | Freq: Every day | ORAL | Status: DC
  Administered 2014-01-21 – 2014-01-22 (×2): 10 mg via ORAL
  Filled 2014-01-21 (×3): qty 1

## 2014-01-21 MED ORDER — LIDOCAINE HCL (CARDIAC) 20 MG/ML IV SOLN
INTRAVENOUS | Status: DC | PRN
  Administered 2014-01-21: 60 mg via INTRAVENOUS

## 2014-01-21 MED ORDER — ACETAMINOPHEN 10 MG/ML IV SOLN
INTRAVENOUS | Status: DC | PRN
  Administered 2014-01-21: 1000 mg via INTRAVENOUS

## 2014-01-21 MED ORDER — ROPIVACAINE HCL 5 MG/ML IJ SOLN
INTRAMUSCULAR | Status: DC | PRN
  Administered 2014-01-21: 30 mL via PERINEURAL

## 2014-01-21 MED ORDER — SODIUM CHLORIDE 0.9 % IR SOLN
Status: DC | PRN
  Administered 2014-01-21: 3000 mL

## 2014-01-21 MED ORDER — DEXTROSE 5 % IV SOLN
500.0000 mg | Freq: Four times a day (QID) | INTRAVENOUS | Status: DC | PRN
  Filled 2014-01-21: qty 5

## 2014-01-21 MED ORDER — NITROGLYCERIN 0.4 MG SL SUBL: 0.4000 mg | SUBLINGUAL_TABLET | SUBLINGUAL | Status: DC | PRN

## 2014-01-21 MED ORDER — WARFARIN VIDEO
Freq: Once | Status: AC
  Administered 2014-01-21: 13:00:00

## 2014-01-21 MED ORDER — BUPIVACAINE-EPINEPHRINE (PF) 0.25% -1:200000 IJ SOLN
INTRAMUSCULAR | Status: AC
  Filled 2014-01-21: qty 30

## 2014-01-21 MED ORDER — EPHEDRINE SULFATE 50 MG/ML IJ SOLN
INTRAMUSCULAR | Status: AC
  Filled 2014-01-21: qty 1

## 2014-01-21 MED ORDER — ROCURONIUM BROMIDE 50 MG/5ML IV SOLN
INTRAVENOUS | Status: AC
  Filled 2014-01-21: qty 1

## 2014-01-21 MED ORDER — ALBUTEROL SULFATE (2.5 MG/3ML) 0.083% IN NEBU: 2.0000 mL | INHALATION_SOLUTION | Freq: Four times a day (QID) | RESPIRATORY_TRACT | Status: DC | PRN

## 2014-01-21 MED ORDER — PROPOFOL 10 MG/ML IV BOLUS
INTRAVENOUS | Status: AC
  Filled 2014-01-21: qty 20

## 2014-01-21 MED ORDER — DEXAMETHASONE SODIUM PHOSPHATE 4 MG/ML IJ SOLN
INTRAMUSCULAR | Status: AC
  Filled 2014-01-21: qty 1

## 2014-01-21 MED ORDER — ONDANSETRON HCL 4 MG/2ML IJ SOLN
INTRAMUSCULAR | Status: AC
  Filled 2014-01-21: qty 2

## 2014-01-21 MED ORDER — LACTATED RINGERS IV SOLN
INTRAVENOUS | Status: DC | PRN
  Administered 2014-01-21 (×2): via INTRAVENOUS

## 2014-01-21 MED ORDER — CEFAZOLIN SODIUM-DEXTROSE 2-3 GM-% IV SOLR
2.0000 g | Freq: Four times a day (QID) | INTRAVENOUS | Status: AC
  Administered 2014-01-21 (×2): 2 g via INTRAVENOUS
  Filled 2014-01-21 (×2): qty 50

## 2014-01-21 MED ORDER — DEXAMETHASONE SODIUM PHOSPHATE 4 MG/ML IJ SOLN
INTRAMUSCULAR | Status: DC | PRN
  Administered 2014-01-21: 4 mg via INTRAVENOUS

## 2014-01-21 MED ORDER — ENOXAPARIN SODIUM 30 MG/0.3ML ~~LOC~~ SOLN
30.0000 mg | Freq: Two times a day (BID) | SUBCUTANEOUS | Status: DC
  Administered 2014-01-22 – 2014-01-24 (×5): 30 mg via SUBCUTANEOUS
  Filled 2014-01-21 (×8): qty 0.3

## 2014-01-21 MED ORDER — TRAMADOL HCL 50 MG PO TABS
50.0000 mg | ORAL_TABLET | Freq: Four times a day (QID) | ORAL | Status: DC | PRN
  Administered 2014-01-25: 50 mg via ORAL
  Filled 2014-01-21: qty 1

## 2014-01-21 MED ORDER — MIDAZOLAM HCL 5 MG/5ML IJ SOLN
INTRAMUSCULAR | Status: DC | PRN
  Administered 2014-01-21: 2 mg via INTRAVENOUS

## 2014-01-21 MED ORDER — ONDANSETRON HCL 4 MG/2ML IJ SOLN: 4.0000 mg | Freq: Four times a day (QID) | INTRAMUSCULAR | Status: DC | PRN

## 2014-01-21 MED ORDER — ONDANSETRON HCL 4 MG PO TABS: 4.0000 mg | ORAL_TABLET | Freq: Four times a day (QID) | ORAL | Status: DC | PRN

## 2014-01-21 MED ORDER — SODIUM CHLORIDE 0.9 % IR SOLN
Status: DC | PRN
  Administered 2014-01-21: 1000 mL

## 2014-01-21 MED ORDER — MIDAZOLAM HCL 2 MG/2ML IJ SOLN
INTRAMUSCULAR | Status: AC
  Filled 2014-01-21: qty 2

## 2014-01-21 MED ORDER — SODIUM CHLORIDE 0.9 % IJ SOLN
INTRAMUSCULAR | Status: AC
  Filled 2014-01-21: qty 10

## 2014-01-21 MED ORDER — FENTANYL CITRATE 0.05 MG/ML IJ SOLN
INTRAMUSCULAR | Status: DC | PRN
  Administered 2014-01-21: 50 ug via INTRAVENOUS
  Administered 2014-01-21 (×2): 25 ug via INTRAVENOUS
  Administered 2014-01-21: 50 ug via INTRAVENOUS
  Administered 2014-01-21: 100 ug via INTRAVENOUS
  Administered 2014-01-21: 50 ug via INTRAVENOUS

## 2014-01-21 MED ORDER — SERTRALINE HCL 50 MG PO TABS
150.0000 mg | ORAL_TABLET | Freq: Every morning | ORAL | Status: DC
  Administered 2014-01-21 – 2014-01-26 (×6): 150 mg via ORAL
  Filled 2014-01-21 (×6): qty 1

## 2014-01-21 MED ORDER — TRAZODONE 25 MG HALF TABLET
25.0000 mg | ORAL_TABLET | Freq: Every evening | ORAL | Status: DC | PRN
  Filled 2014-01-21: qty 8

## 2014-01-21 MED ORDER — OXYCODONE-ACETAMINOPHEN 5-325 MG PO TABS: 1.0000 | ORAL_TABLET | ORAL | Status: DC | PRN

## 2014-01-21 MED ORDER — BISACODYL 10 MG RE SUPP: 10.0000 mg | Freq: Every day | RECTAL | Status: DC | PRN

## 2014-01-21 MED ORDER — METHOCARBAMOL 500 MG PO TABS: 500.0000 mg | ORAL_TABLET | Freq: Three times a day (TID) | ORAL | Status: DC | PRN

## 2014-01-21 MED ORDER — TRAZODONE HCL 50 MG PO TABS
50.0000 mg | ORAL_TABLET | Freq: Every evening | ORAL | Status: DC | PRN
  Filled 2014-01-21: qty 1

## 2014-01-21 MED ORDER — ONDANSETRON HCL 4 MG/2ML IJ SOLN
INTRAMUSCULAR | Status: DC | PRN
Start: 2014-01-21 — End: 2014-01-21
  Administered 2014-01-21: 4 mg via INTRAVENOUS

## 2014-01-21 MED ORDER — METHOCARBAMOL 500 MG PO TABS
500.0000 mg | ORAL_TABLET | Freq: Four times a day (QID) | ORAL | Status: DC | PRN
  Administered 2014-01-21 – 2014-01-26 (×13): 500 mg via ORAL
  Filled 2014-01-21 (×13): qty 1

## 2014-01-21 MED ORDER — OXYCODONE HCL 5 MG PO TABS
5.0000 mg | ORAL_TABLET | ORAL | Status: DC | PRN
  Administered 2014-01-21 – 2014-01-26 (×24): 10 mg via ORAL
  Filled 2014-01-21 (×25): qty 2

## 2014-01-21 MED ORDER — FENTANYL CITRATE 0.05 MG/ML IJ SOLN
INTRAMUSCULAR | Status: AC
  Filled 2014-01-21: qty 5

## 2014-01-21 MED ORDER — ACETAMINOPHEN 10 MG/ML IV SOLN
INTRAVENOUS | Status: AC
  Filled 2014-01-21: qty 100

## 2014-01-21 MED ORDER — MENTHOL 3 MG MT LOZG: 1.0000 | LOZENGE | OROMUCOSAL | Status: DC | PRN

## 2014-01-21 MED ORDER — PHENOL 1.4 % MT LIQD: 1.0000 | OROMUCOSAL | Status: DC | PRN

## 2014-01-21 MED ORDER — HYDROMORPHONE HCL 1 MG/ML IJ SOLN
0.2500 mg | INTRAMUSCULAR | Status: DC | PRN
  Administered 2014-01-21 (×3): 0.5 mg via INTRAVENOUS

## 2014-01-21 MED ORDER — METOCLOPRAMIDE HCL 10 MG PO TABS: 5.0000 mg | ORAL_TABLET | Freq: Three times a day (TID) | ORAL | Status: DC | PRN

## 2014-01-21 MED ORDER — LISINOPRIL-HYDROCHLOROTHIAZIDE 10-12.5 MG PO TABS: 1.0000 | ORAL_TABLET | Freq: Every day | ORAL | Status: DC

## 2014-01-21 MED ORDER — LIDOCAINE HCL (CARDIAC) 20 MG/ML IV SOLN
INTRAVENOUS | Status: AC
  Filled 2014-01-21: qty 5

## 2014-01-21 MED ORDER — SODIUM CHLORIDE 0.9 % IV SOLN
INTRAVENOUS | Status: DC
  Administered 2014-01-21 – 2014-01-23 (×2): via INTRAVENOUS

## 2014-01-21 MED ORDER — WARFARIN SODIUM 5 MG PO TABS: 5.0000 mg | ORAL_TABLET | Freq: Every day | ORAL | Status: DC

## 2014-01-21 MED ORDER — LISINOPRIL 10 MG PO TABS
10.0000 mg | ORAL_TABLET | Freq: Every day | ORAL | Status: DC
  Administered 2014-01-21 – 2014-01-26 (×6): 10 mg via ORAL
  Filled 2014-01-21 (×7): qty 1

## 2014-01-21 MED ORDER — HYDROCHLOROTHIAZIDE 12.5 MG PO CAPS
12.5000 mg | ORAL_CAPSULE | Freq: Every day | ORAL | Status: DC
  Administered 2014-01-21 – 2014-01-26 (×6): 12.5 mg via ORAL
  Filled 2014-01-21 (×7): qty 1

## 2014-01-21 SURGICAL SUPPLY — 60 items
BANDAGE ESMARK 6X9 LF (GAUZE/BANDAGES/DRESSINGS) ×1 IMPLANT
BLADE SAG 18X100X1.27 (BLADE) ×3 IMPLANT
BLADE SAW SGTL 13.0X1.19X90.0M (BLADE) ×3 IMPLANT
BNDG CMPR 9X6 STRL LF SNTH (GAUZE/BANDAGES/DRESSINGS) ×1
BNDG CMPR MED 10X6 ELC LF (GAUZE/BANDAGES/DRESSINGS) ×1
BNDG ELASTIC 6X10 VLCR STRL LF (GAUZE/BANDAGES/DRESSINGS) ×3 IMPLANT
BNDG ESMARK 6X9 LF (GAUZE/BANDAGES/DRESSINGS) ×3
BNDG GAUZE ELAST 4 BULKY (GAUZE/BANDAGES/DRESSINGS) ×6 IMPLANT
BOWL SMART MIX CTS (DISPOSABLE) ×3 IMPLANT
CAP KNEE TOTAL 3 SIGMA ×2 IMPLANT
CEMENT HV SMART SET (Cement) ×6 IMPLANT
CLOSURE WOUND 1/2 X4 (GAUZE/BANDAGES/DRESSINGS) ×2
COVER SURGICAL LIGHT HANDLE (MISCELLANEOUS) ×3 IMPLANT
CUFF TOURNIQUET SINGLE 34IN LL (TOURNIQUET CUFF) ×2 IMPLANT
CUFF TOURNIQUET SINGLE 44IN (TOURNIQUET CUFF) IMPLANT
DRAPE EXTREMITY T 121X128X90 (DRAPE) ×3 IMPLANT
DRAPE IMP U-DRAPE 54X76 (DRAPES) ×3 IMPLANT
DRAPE ORTHO SPLIT 77X108 STRL (DRAPES) ×6
DRAPE PROXIMA HALF (DRAPES) ×3 IMPLANT
DRAPE SURG ORHT 6 SPLT 77X108 (DRAPES) IMPLANT
DRAPE U-SHAPE 47X51 STRL (DRAPES) ×3 IMPLANT
DRSG ADAPTIC 3X8 NADH LF (GAUZE/BANDAGES/DRESSINGS) ×3 IMPLANT
DRSG PAD ABDOMINAL 8X10 ST (GAUZE/BANDAGES/DRESSINGS) ×6 IMPLANT
DURAPREP 26ML APPLICATOR (WOUND CARE) ×5 IMPLANT
ELECT CAUTERY BLADE 6.4 (BLADE) ×3 IMPLANT
ELECT REM PT RETURN 9FT ADLT (ELECTROSURGICAL) ×3
ELECTRODE REM PT RTRN 9FT ADLT (ELECTROSURGICAL) ×1 IMPLANT
GAUZE SPONGE 4X4 12PLY STRL (GAUZE/BANDAGES/DRESSINGS) ×3 IMPLANT
GLOVE BIOGEL PI ORTHO PRO 7.5 (GLOVE) ×2
GLOVE BIOGEL PI ORTHO PRO SZ8 (GLOVE) ×2
GLOVE ORTHO TXT STRL SZ7.5 (GLOVE) ×3 IMPLANT
GLOVE PI ORTHO PRO STRL 7.5 (GLOVE) ×1 IMPLANT
GLOVE PI ORTHO PRO STRL SZ8 (GLOVE) ×1 IMPLANT
GLOVE SURG ORTHO 8.5 STRL (GLOVE) ×3 IMPLANT
GOWN STRL REUS W/ TWL XL LVL3 (GOWN DISPOSABLE) ×3 IMPLANT
GOWN STRL REUS W/TWL XL LVL3 (GOWN DISPOSABLE) ×9
HANDPIECE INTERPULSE COAX TIP (DISPOSABLE) ×3
IMMOBILIZER KNEE 22 UNIV (SOFTGOODS) ×2 IMPLANT
KIT BASIN OR (CUSTOM PROCEDURE TRAY) ×3 IMPLANT
KIT MANIFOLD (MISCELLANEOUS) ×3 IMPLANT
KIT ROOM TURNOVER OR (KITS) ×3 IMPLANT
MANIFOLD NEPTUNE II (INSTRUMENTS) ×3 IMPLANT
NS IRRIG 1000ML POUR BTL (IV SOLUTION) ×3 IMPLANT
PACK TOTAL JOINT (CUSTOM PROCEDURE TRAY) ×3 IMPLANT
PACK UNIVERSAL I (CUSTOM PROCEDURE TRAY) ×3 IMPLANT
PAD ARMBOARD 7.5X6 YLW CONV (MISCELLANEOUS) ×6 IMPLANT
SET HNDPC FAN SPRY TIP SCT (DISPOSABLE) ×1 IMPLANT
STRIP CLOSURE SKIN 1/2X4 (GAUZE/BANDAGES/DRESSINGS) ×4 IMPLANT
SUCTION FRAZIER TIP 10 FR DISP (SUCTIONS) ×3 IMPLANT
SUT MNCRL AB 3-0 PS2 18 (SUTURE) ×3 IMPLANT
SUT VIC AB 0 CT1 27 (SUTURE) ×6
SUT VIC AB 0 CT1 27XBRD ANBCTR (SUTURE) ×2 IMPLANT
SUT VIC AB 1 CT1 27 (SUTURE) ×9
SUT VIC AB 1 CT1 27XBRD ANBCTR (SUTURE) ×3 IMPLANT
SUT VIC AB 2-0 CT1 27 (SUTURE) ×6
SUT VIC AB 2-0 CT1 TAPERPNT 27 (SUTURE) ×2 IMPLANT
TOWEL OR 17X24 6PK STRL BLUE (TOWEL DISPOSABLE) ×3 IMPLANT
TOWEL OR 17X26 10 PK STRL BLUE (TOWEL DISPOSABLE) ×3 IMPLANT
TRAY FOLEY CATH 16FRSI W/METER (SET/KITS/TRAYS/PACK) ×2 IMPLANT
WATER STERILE IRR 1000ML POUR (IV SOLUTION) ×2 IMPLANT

## 2014-01-21 NOTE — Brief Op Note (Signed)
01/21/2014  9:49 AM  PATIENT:  Elmo PuttMillard R Bolda  64 y.o. male  PRE-OPERATIVE DIAGNOSIS:  LEFT KNEE OA, END STAGE  POST-OPERATIVE DIAGNOSIS:  LEFT KNEE OA, END STAGE   PROCEDURE:  Procedure(s): LEFT TOTAL KNEE ARTHROPLASTY (Left), DEPUY SIGMA RP  SURGEON:  Surgeon(s) and Role:    * Verlee RossettiSteven R Jadalynn Burr, MD - Primary  PHYSICIAN ASSISTANT:   ASSISTANTS: Thea Gisthomas B Dixon, PA-C   ANESTHESIA:   regional and general  EBL:  Total I/O In: 1000 [I.V.:1000] Out: 280 [Urine:280]  BLOOD ADMINISTERED:none  DRAINS: none   LOCAL MEDICATIONS USED:  NONE  SPECIMEN:  No Specimen  DISPOSITION OF SPECIMEN:  N/A  COUNTS:  YES  TOURNIQUET:   Total Tourniquet Time Documented: Thigh (Left) - 100 minutes Total: Thigh (Left) - 100 minutes   DICTATION: .Other Dictation: Dictation Number [id number]  PLAN OF CARE: Admit to inpatient   PATIENT DISPOSITION:  PACU - hemodynamically stable.   Delay start of Pharmacological VTE agent (>24hrs) due to surgical blood loss or risk of bleeding: no

## 2014-01-21 NOTE — Anesthesia Postprocedure Evaluation (Signed)
  Anesthesia Post-op Note  Patient: [name]  Procedure(s) Performed: Procedure(s): LEFT TOTAL KNEE ARTHROPLASTY (Left)  Patient Location: PACU  Anesthesia Type:General  Level of Consciousness: awake, alert  and oriented  Airway and Oxygen Therapy: Patient Spontanous Breathing  Post-op Pain: none  Post-op Assessment: Post-op Vital signs reviewed  Post-op Vital Signs: Reviewed and stable  Last Vitals:  Filed Vitals:   01/21/14 1115  BP: 150/77  Pulse: 78  Temp: 36.7 C  Resp: 20    Complications: No apparent anesthesia complications

## 2014-01-21 NOTE — Anesthesia Preprocedure Evaluation (Addendum)
Anesthesia Evaluation  Patient identified by MRN, date of birth, ID band Patient awake    Reviewed: Allergy & Precautions, H&P , NPO status , Patient's Chart, lab work & pertinent test results  Airway Mallampati: III  TM Distance: >3 FB Neck ROM: Full    Dental no notable dental hx. (+) Edentulous Upper, Edentulous Lower, Dental Advisory Given   Pulmonary asthma , sleep apnea , COPD COPD inhaler, Current Smoker,  breath sounds clear to auscultation  Pulmonary exam normal       Cardiovascular hypertension, Pt. on medications + CAD, + Past MI and +CHF + dysrhythmias + pacemaker Rhythm:Regular Rate:Normal     Neuro/Psych PSYCHIATRIC DISORDERS Anxiety Depression Bipolar Disorder negative neurological ROS     GI/Hepatic negative GI ROS, Neg liver ROS,   Endo/Other  Morbid obesity  Renal/GU negative Renal ROS  negative genitourinary   Musculoskeletal  (+) Arthritis -, Osteoarthritis,    Abdominal   Peds  Hematology negative hematology ROS (+)   Anesthesia Other Findings   Reproductive/Obstetrics negative OB ROS                          Anesthesia Physical Anesthesia Plan  ASA: III  Anesthesia Plan: General and Regional   Post-op Pain Management:    Induction: Intravenous  Airway Management Planned: LMA  Additional Equipment:   Intra-op Plan:   Post-operative Plan: Extubation in OR  Informed Consent: I have reviewed the patients History and Physical, chart, labs and discussed the procedure including the risks, benefits and alternatives for the proposed anesthesia with the patient or authorized representative who has indicated his/her understanding and acceptance.   Dental advisory given  Plan Discussed with: CRNA  Anesthesia Plan Comments:         Anesthesia Quick Evaluation

## 2014-01-21 NOTE — Transfer of Care (Signed)
Immediate Anesthesia Transfer of Care Note  Patient: [name]  Procedure(s) Performed: Procedure(s): LEFT TOTAL KNEE ARTHROPLASTY (Left)  Patient Location: PACU  Anesthesia Type:General  Level of Consciousness: awake, alert  and oriented  Airway & Oxygen Therapy: Patient Spontanous Breathing and Patient connected to face mask oxygen  Post-op Assessment: Report given to PACU RN, Post -op Vital signs reviewed and stable and Patient moving all extremities X 4  Post vital signs: Reviewed and stable  Complications: No apparent anesthesia complications

## 2014-01-21 NOTE — Progress Notes (Signed)
Report given to diane rn as caregiver 

## 2014-01-21 NOTE — Progress Notes (Signed)
CARE MANAGEMENT NOTE 01/21/2014  Patient:  [name],[name]   Account Number:  [id number]  Date Initiated:  01/21/2014  Documentation initiated by:  [facility]Abbe Bula  Subjective/Objective Assessment:   LEFT TOTAL KNEE ARTHROPLASTY     Action/Plan:   Anticipated DC Date:  01/24/2014   Anticipated DC Plan:  HOME W HOME HEALTH SERVICES      DC Planning Services  CM consult      [facility]  [facility] HEALTH   Choice offered to / List presented to:  C-1 Patient   DME arranged  3-N-1  WALKER - ROLLING  CPM      DME agency  [employer]     HH arranged  HH-2 PT      HH agency  [facility]   Status of service:  Completed, signed off Medicare Important Message given?  NO (If response is "NO", the following Medicare IM given date fields will be blank) Date Medicare IM given:   Medicare IM given by:   Date Additional Medicare IM given:   Additional Medicare IM given by:    Discharge Disposition:  HOME W HOME HEALTH SERVICES  Per UR Regulation:    If discussed at Long Length of Stay Meetings, dates discussed:    Comments:  01/21/2014 1530 NCM spoke to pt and offered choice for [facility]. [facility]. Pt requesting RW and 3n1 for home. [employer] will deliver DME to pt.  Isidoro DonningAlesia Tam Savoia RN CCM Case Mgmt phone [phone]

## 2014-01-21 NOTE — Interval H&P Note (Signed)
History and Physical Interval Note:  01/21/2014 7:36 AM  [name]  has presented today for surgery, with the diagnosis of LEFT KNEE OA   The various methods of treatment have been discussed with the patient and family. After consideration of risks, benefits and other options for treatment, the patient has consented to  Procedure(s): LEFT TOTAL KNEE ARTHROPLASTY (Left) as a surgical intervention .  The patient's history has been reviewed, patient examined, no change in status, stable for surgery.  I have reviewed the patient's chart and labs.  Questions were answered to the patient's satisfaction.     Belenda Alviar,STEVEN R

## 2014-01-21 NOTE — Progress Notes (Signed)
ANTICOAGULATION CONSULT NOTE - Initial Consult  Pharmacy Consult for Coumadin Indication: VTE prophylaxis  No Known Allergies  Patient Measurements: Height: 5\' 8"  (172.7 cm) Weight: 299 lb (135.626 kg) IBW/kg (Calculated) : 68.4  Vital Signs: Temp: 98 F (36.7 C) (01/08 1215) BP: 124/75 mmHg (01/08 1215) Pulse Rate: 74 (01/08 1215)  Labs: No results for input(s): HGB, HCT, PLT, APTT, LABPROT, INR, HEPARINUNFRC, CREATININE, CKTOTAL, CKMB, TROPONINI in the last 72 hours.  Estimated Creatinine Clearance: 124.4 mL/min (by C-G formula based on Cr of 0.83).   Medical History: Past Medical History  Diagnosis Date  . Cervical radiculopathy   . Asthma   . Cardiac pacemaker in situ 06/11/02    Dual chamber PM for symptomatic intermittent high-grade heart block with syncope  . Knee pain   . COPD (chronic obstructive pulmonary disease)   . CHF (congestive heart failure)   . DJD (degenerative joint disease), cervical   . Bone spur   . Chest pain 07/01/13    Went to ED  . Neck pain 06/2013    Left sided with pain radiating down left arm. Pain associated with numbness in left arm. Worse with movement.   . Obesity   . CAD (coronary artery disease)   . Syncope   . Hypertension   . Hyperlipidemia   . Myocardial infarction   . Presence of permanent cardiac pacemaker     Dr Sharrell KuGreg Taylor  . OSA (obstructive sleep apnea)     stopped using CPAP 2008  . Shortness of breath dyspnea     with exertion  . Depression   . Anxiety   . Bipolar disorder     Assessment: 64 year old male s/p TKA to begin Coumadin for VTE prophylaxis  Goal of Therapy:  INR 2-3 Monitor platelets by anticoagulation protocol: Yes   Plan:  Coumadin 10 mg po daily at 1800 pm Daily INR  Thank you. Okey RegalLisa Prentiss Polio, PharmD [phone] 01/21/2014,12:59 PM

## 2014-01-21 NOTE — Progress Notes (Signed)
Physical Therapy Treatment Patient Details Name: [name] MRN: [id number] DOB: 1950/07/20 Today's Date: 01/21/2014    History of Present Illness pt is a 64 y.o. male s/p Lt TKA.    PT Comments    Pt is s/p Lt TKA POD#0 resulting in the deficits listed below (see PT Problem List).  Pt will benefit from skilled PT to increase their independence and safety with mobility to allow discharge to the venue listed below. May need 2 person when attempting ambulation. Pt did have LOB posteriorly when performing SPT.    Follow Up Recommendations  Home health PT;Supervision/Assistance - 24 hour     Equipment Recommendations  Rolling walker with 5" wheels;3in1 (PT)    Recommendations for Other Services OT consult     Precautions / Restrictions Precautions Precautions: Fall;Knee Precaution Comments: educated on no pillow under knee; bed was fully bent; locked out bed in extension Required Braces or Orthoses: Knee Immobilizer - Left Knee Immobilizer - Left: On when out of bed or walking Restrictions Weight Bearing Restrictions: No LLE Weight Bearing: Weight bearing as tolerated    Mobility  Bed Mobility Overal bed mobility: Needs Assistance Bed Mobility: Supine to Sit     Supine to sit: Min assist;HOB elevated     General bed mobility comments: (A) to advance Lt LE to/off EOB; heavily using handrails; cues for sequencing  Transfers Overall transfer level: Needs assistance Equipment used: Rolling walker (2 wheeled) Transfers: Sit to/from [employer] Sit to Stand: Min assist Stand pivot transfers: Mod assist       General transfer comment: cues for hand placement and sequencing; pt with LOB posteriorly when performing pivotal steps and unable to control descent to chair  Ambulation/Gait             General Gait Details: pivotal steps to chair    Stairs            Wheelchair Mobility    Modified Rankin (Stroke Patients Only)        Balance Overall balance assessment: Needs assistance;History of Falls Sitting-balance support: Feet supported;No upper extremity supported Sitting balance-Leahy Scale: Fair Sitting balance - Comments: no dizziness sitting EOB   Standing balance support: Bilateral upper extremity supported;During functional activity Standing balance-Leahy Scale: Poor Standing balance comment: RW and min-mod (A) to balance                     Cognition Arousal/Alertness: Awake/alert Behavior During Therapy: WFL for tasks assessed/performed Overall Cognitive Status: Within Functional Limits for tasks assessed                      Exercises Total Joint Exercises Ankle Circles/Pumps: AAROM;AROM;Both;10 reps;Seated    General Comments        Pertinent Vitals/Pain Pain Assessment: 0-10 Pain Score: 6  Pain Location: Lt knee Pain Descriptors / Indicators: Aching Pain Intervention(s): Monitored during session;Premedicated before session;Repositioned    Home Living Family/patient expects to be discharged to:: Private residence Living Arrangements: Other relatives;Children Available Help at Discharge: Family;Available 24 hours/day Type of Home: House Home Access: Stairs to enter Entrance Stairs-Rails: None Home Layout: Able to live on main level with bedroom/bathroom Home Equipment: Cane - single point Additional Comments: pt has tub shower; sleeps on "floor"     Prior Function Level of Independence: Independent with assistive device(s)      Comments: ambulates with SPC at all times    PT Goals (current goals can now be found  in the care plan section) Acute Rehab PT Goals Patient Stated Goal: to go home by monday PT Goal Formulation: With patient Time For Goal Achievement: 01/28/14 Potential to Achieve Goals: Good    Frequency  7X/week    PT Plan      Co-evaluation             End of Session Equipment Utilized During Treatment: Gait belt;Left knee  immobilizer Activity Tolerance: Patient tolerated treatment well Patient left: in chair;with call bell/phone within reach     Time: 1359-1416 PT Time Calculation (min) (ACUTE ONLY): 17 min  Charges:  $Therapeutic Activity: 8-22 mins                    G CodesDonell Sievert:      Therma Lasure N,  [place]  [phone] 01/21/2014, 2:30 PM

## 2014-01-21 NOTE — Anesthesia Procedure Notes (Addendum)
Anesthesia Regional Block:  Femoral nerve block  Pre-Anesthetic Checklist: ,, timeout performed, Correct Patient, Correct Site, Correct Laterality, Correct Procedure, Correct Position, site marked, Risks and benefits discussed, pre-op evaluation,  At surgeon's request and post-op pain management  Laterality: Left  Prep: Maximum Sterile Barrier Precautions used and chloraprep       Needles:  Injection technique: Single-shot  Needle Type: Echogenic Stimulator Needle     Needle Length: 5cm 5 cm Needle Gauge: 22 and 22 G    Additional Needles:  Procedures: ultrasound guided (picture in chart) Femoral nerve block  Nerve Stimulator or Paresthesia:  Response: Patellar respose,   Additional Responses:   Narrative:  Start time: 01/21/2014 7:10 AM End time: 01/21/2014 7:19 AM Injection made incrementally with aspirations every 5 mL.  Performed by: Personally  Anesthesiologist: Gaynelle AduFITZGERALD, WILLIAM E  Additional Notes: 2% Lidocaine skin wheel.    Procedure Name: LMA Insertion Date/Time: 01/21/2014 7:45 AM Performed by: Sharlene DoryWALKER, Nasiah Lehenbauer E Pre-anesthesia Checklist: Patient identified, Emergency Drugs available, Suction available, Patient being monitored and Timeout performed Patient Re-evaluated:Patient Re-evaluated prior to inductionOxygen Delivery Method: Circle system utilized Preoxygenation: Pre-oxygenation with 100% oxygen Intubation Type: IV induction LMA: LMA inserted LMA Size: 4.0 Number of attempts: 1 Placement Confirmation: positive ETCO2 and breath sounds checked- equal and bilateral Tube secured with: Tape Dental Injury: Teeth and Oropharynx as per pre-operative assessment

## 2014-01-21 NOTE — Progress Notes (Signed)
Orthopedic Tech Progress Note Patient Details:  [name] 1950-09-21 [id number] CPM applied to LLE with appropriate settings. OHF applied to bed. CPM Left Knee CPM Left Knee: On Left Knee Flexion (Degrees): 90 Left Knee Extension (Degrees): 0   Asia R Thompson 01/21/2014, 11:14 AM

## 2014-01-22 LAB — CBC
HCT: 41.5 % (ref 39.0–52.0)
HEMOGLOBIN: 12.9 g/dL — AB (ref 13.0–17.0)
MCH: 29.3 pg (ref 26.0–34.0)
MCHC: 31.1 g/dL (ref 30.0–36.0)
MCV: 94.3 fL (ref 78.0–100.0)
PLATELETS: 212 10*3/uL (ref 150–400)
RBC: 4.4 MIL/uL (ref 4.22–5.81)
RDW: 15.1 % (ref 11.5–15.5)
WBC: 12.2 10*3/uL — AB (ref 4.0–10.5)

## 2014-01-22 LAB — BASIC METABOLIC PANEL
Anion gap: 11 (ref 5–15)
BUN: 11 mg/dL (ref 6–23)
CO2: 27 mmol/L (ref 19–32)
CREATININE: 0.84 mg/dL (ref 0.50–1.35)
Calcium: 8.2 mg/dL — ABNORMAL LOW (ref 8.4–10.5)
Chloride: 100 mEq/L (ref 96–112)
GFR calc Af Amer: >90 mL/min (ref >90)
GFR calc non Af Amer: >90 mL/min (ref >90)
Glucose, Bld: 107 mg/dL — ABNORMAL HIGH (ref 70–99)
Potassium: 4.6 mmol/L (ref 3.5–5.1)
SODIUM: 138 mmol/L (ref 135–145)

## 2014-01-22 LAB — PROTIME-INR
INR: 1.07 (ref 0.00–1.49)
Prothrombin Time: 14 seconds (ref 11.6–15.2)

## 2014-01-22 MED ORDER — ATORVASTATIN CALCIUM 20 MG PO TABS
20.0000 mg | ORAL_TABLET | Freq: Every day | ORAL | Status: DC
  Administered 2014-01-23 – 2014-01-26 (×4): 20 mg via ORAL
  Filled 2014-01-22 (×4): qty 1

## 2014-01-22 MED ORDER — PNEUMOCOCCAL VAC POLYVALENT 25 MCG/0.5ML IJ INJ: 0.5000 mL | INJECTION | INTRAMUSCULAR | Status: DC

## 2014-01-22 NOTE — Progress Notes (Signed)
ANTICOAGULATION CONSULT NOTE Pharmacy Consult for Coumadin Indication: VTE prophylaxis  No Known Allergies  Patient Measurements: Height: 5\' 8"  (172.7 cm) Weight: 299 lb (135.626 kg) IBW/kg (Calculated) : 68.4  Vital Signs: Temp: 98.3 F (36.8 C) (01/09 0458) BP: 146/82 mmHg (01/09 0940) Pulse Rate: 92 (01/09 0940)  Labs:  Recent Labs  01/21/14 1425 01/22/14 0440  HGB 15.4 12.9*  HCT 48.4 41.5  PLT 268 212  LABPROT  --  14.0  INR  --  1.07  CREATININE 0.99 0.84    Estimated Creatinine Clearance: 122.9 mL/min (by C-G formula based on Cr of 0.84).  Assessment: 64 year old male s/p TKA continues on  Coumadin for VTE prophylaxis INR today = 1.07 No bleeding noted  Goal of Therapy:  INR 2-3 Monitor platelets by anticoagulation protocol: Yes   Plan:  Coumadin 10 mg po daily at 1800 pm Lovenox 30 mg sq Q 12 hours Daily INR  Thank you. Okey RegalLisa Khaalid Lefkowitz, PharmD [phone] 01/22/2014,11:19 AM

## 2014-01-22 NOTE — Progress Notes (Signed)
Orthopedics Progress Note  Subjective: I am hurting this morning  Objective:  Filed Vitals:   01/22/14 0458  BP: 137/70  Pulse: 87  Temp: 98.3 F (36.8 C)  Resp: 18    General: Awake and alert  Musculoskeletal: left knee dressing CDI, no pain with calf pumps Neurovascularly intact  Lab Results  Component Value Date   WBC 12.2* 01/22/2014   HGB 12.9* 01/22/2014   HCT 41.5 01/22/2014   MCV 94.3 01/22/2014   PLT 212 01/22/2014       Component Value Date/Time   NA 138 01/22/2014 0440   K 4.6 01/22/2014 0440   CL 100 01/22/2014 0440   CO2 27 01/22/2014 0440   GLUCOSE 107* 01/22/2014 0440   BUN 11 01/22/2014 0440   CREATININE 0.84 01/22/2014 0440   CALCIUM 8.2* 01/22/2014 0440   GFRNONAA >90 01/22/2014 0440   GFRAA >90 01/22/2014 0440    Lab Results  Component Value Date   INR 1.07 01/22/2014   INR 1.01 01/11/2014    Assessment/Plan: POD #1 s/p Procedure(s): LEFT TOTAL KNEE ARTHROPLASTY OOB, Pt, OT DVT prophylaxis, SCDs, Lovenox, Coumadin  Almedia BallsSteven R. Ranell PatrickNorris, MD 01/22/2014 7:31 AM

## 2014-01-22 NOTE — Discharge Instructions (Signed)
Ice to the knee at all times.  DO NOT PROP ANYTHING BEHIND THE KNEE.  Make sure that you place rolled towel under ankle to allow knee to go straight.  IT IS VERY IMPORTANT to work on straightening the knee.  Relax and dangle the knee over the edge of the bed for 90 degrees of bend.  Keep the incision clean and dry for one week, then ok to shower.  Keep the wound covered and dry until then.  Follow up in two weeks in the office with Dr Ranell PatrickNorris [phone]  CPM for 0-90 degrees 6 hours per day at least in two hour blocks  Information on my medicine - Coumadin   (Warfarin)  This medication education was reviewed with me or my healthcare representative as part of my discharge preparation.  The pharmacist that spoke with me during my hospital stay was:  Elwin Sleightowell, Lowen Barringer Kay, [facility] Center[facility]PH  Why was Coumadin prescribed for you? Coumadin was prescribed for you because you have a blood clot or a medical condition that can cause an increased risk of forming blood clots. Blood clots can cause serious health problems by blocking the flow of blood to the heart, lung, or brain. Coumadin can prevent harmful blood clots from forming. As a reminder your indication for Coumadin is:   Blood Clot Prevention After Orthopedic Surgery  What test will check on my response to Coumadin? While on Coumadin (warfarin) you will need to have an INR test regularly to ensure that your dose is keeping you in the desired range. The INR (international normalized ratio) number is calculated from the result of the laboratory test called prothrombin time (PT).  If an INR APPOINTMENT HAS NOT ALREADY BEEN MADE FOR YOU please schedule an appointment to have this lab work done by your health care provider within 7 days. Your INR goal is usually a number between:  2 to 3 or your provider may give you a more narrow range like 2-2.5.  Ask your health care provider during an office visit what your goal INR is.  What  do you need to  know  About   COUMADIN? Take Coumadin (warfarin) exactly as prescribed by your healthcare provider about the same time each day.  DO NOT stop taking without talking to the doctor who prescribed the medication.  Stopping without other blood clot prevention medication to take the place of Coumadin may increase your risk of developing a new clot or stroke.  Get refills before you run out.  What do you do if you miss a dose? If you miss a dose, take it as soon as you remember on the same day then continue your regularly scheduled regimen the next day.  Do not take two doses of Coumadin at the same time.  Important Safety Information A possible side effect of Coumadin (Warfarin) is an increased risk of bleeding. You should call your healthcare provider right away if you experience any of the following: ? Bleeding from an injury or your nose that does not stop. ? Unusual colored urine (red or dark brown) or unusual colored stools (red or black). ? Unusual bruising for unknown reasons. ? A serious fall or if you hit your head (even if there is no bleeding).  Some foods or medicines interact with Coumadin (warfarin) and might alter your response to warfarin. To help avoid this: ? Eat a balanced diet, maintaining a consistent amount of Vitamin K. ? Notify your provider about major diet changes you plan to  make. ? Avoid alcohol or limit your intake to 1 drink for women and 2 drinks for men per day. (1 drink is 5 oz. wine, 12 oz. beer, or 1.5 oz. liquor.)  Make sure that ANY health care provider who prescribes medication for you knows that you are taking Coumadin (warfarin).  Also make sure the healthcare provider who is monitoring your Coumadin knows when you have started a new medication including herbals and non-prescription products.  Coumadin (Warfarin)  Major Drug Interactions  Increased Warfarin Effect Decreased Warfarin Effect  Alcohol (large quantities) Antibiotics (esp. Septra/Bactrim, Flagyl,  Cipro) Amiodarone (Cordarone) Aspirin (ASA) Cimetidine (Tagamet) Megestrol (Megace) NSAIDs (ibuprofen, naproxen, etc.) Piroxicam (Feldene) Propafenone (Rythmol SR) Propranolol (Inderal) Isoniazid (INH) Posaconazole (Noxafil) Barbiturates (Phenobarbital) Carbamazepine (Tegretol) Chlordiazepoxide (Librium) Cholestyramine (Questran) Griseofulvin Oral Contraceptives Rifampin Sucralfate (Carafate) Vitamin K   Coumadin (Warfarin) Major Herbal Interactions  Increased Warfarin Effect Decreased Warfarin Effect  Garlic Ginseng Ginkgo biloba Coenzyme Q10 Green tea St. Johns wort    Coumadin (Warfarin) FOOD Interactions  Eat a consistent number of servings per week of foods HIGH in Vitamin K (1 serving =  cup)  Collards (cooked, or boiled & drained) Kale (cooked, or boiled & drained) Mustard greens (cooked, or boiled & drained) Parsley *serving size only =  cup Spinach (cooked, or boiled & drained) Swiss chard (cooked, or boiled & drained) Turnip greens (cooked, or boiled & drained)  Eat a consistent number of servings per week of foods MEDIUM-HIGH in Vitamin K (1 serving = 1 cup)  Asparagus (cooked, or boiled & drained) Broccoli (cooked, boiled & drained, or raw & chopped) Brussel sprouts (cooked, or boiled & drained) *serving size only =  cup Lettuce, raw (green leaf, endive, romaine) Spinach, raw Turnip greens, raw & chopped   These websites have more information on Coumadin (warfarin):  http://www.king-russell.com/; https://www.hines.net/;

## 2014-01-22 NOTE — Progress Notes (Signed)
Utilization review completed.  

## 2014-01-22 NOTE — Progress Notes (Signed)
Physical Therapy Treatment Patient Details Name: [name] MRN: [id number] DOB: 01/19/1950 Today's Date: 01/22/2014    History of Present Illness pt is a 64 y.o. male s/p Lt TKA. Hx HTN, MI s/p pacemaker, cervical spine degenerative disease, per patient, arthritis in both hips and knees, as well as back pain chronically.    PT Comments    Patient limited by pain this morning, did not sleep well, but agreeable to therapy "its gotta be done".  Weak quads, unable to perform straight leg raise.  Sat at edge of bed after 2 attempts, using trapeze and physical assist, unable this morning to progress to standing.  Patient with intense, acute pain, remains appropriate for skilled PT services with current plan of care.   Follow Up Recommendations  Home health PT;Supervision/Assistance - 24 hour     Equipment Recommendations  Rolling walker with 5" wheels;3in1 (PT)    Recommendations for Other Services OT consult     Precautions / Restrictions Precautions Precautions: Fall;Knee Precaution Comments: educated on no pillow under knee; bed was fully bent; locked out bed in extension Required Braces or Orthoses: Knee Immobilizer - Left Knee Immobilizer - Left: On when out of bed or walking Restrictions Weight Bearing Restrictions: Yes LLE Weight Bearing: Weight bearing as tolerated    Mobility  Bed Mobility Overal bed mobility: Needs Assistance Bed Mobility: Supine to Sit     Supine to sit: HOB elevated;Mod assist        Transfers Overall transfer level: Needs assistance               General transfer comment: Unable due to pain, sit at edge of bed only  Ambulation/Gait                 Stairs            Wheelchair Mobility    Modified Rankin (Stroke Patients Only)       Balance     Sitting balance-Leahy Scale: Fair Sitting balance - Comments: no dizziness sitting EOB   Standing balance support: Bilateral upper extremity supported                         Cognition Arousal/Alertness: Awake/alert Behavior During Therapy: WFL for tasks assessed/performed Overall Cognitive Status: Within Functional Limits for tasks assessed                      Exercises Total Joint Exercises Ankle Circles/Pumps: AAROM;AROM;Both;10 reps;Seated Heel Slides: AAROM;Both;10 reps;Supine Hip ABduction/ADduction: AAROM;10 reps;Both;Supine Straight Leg Raises: AAROM;5 reps;Left;Supine Long Arc Quad: AAROM;5 reps;Left;Seated    General Comments        Pertinent Vitals/Pain Pain Assessment: 0-10 Pain Score: 9  Pain Location: Lt Knee and hip Pain Descriptors / Indicators: Aching;Throbbing Pain Intervention(s): Limited activity within patient's tolerance;Monitored during session;RN gave pain meds during session;Relaxation    Home Living                      Prior Function            PT Goals (current goals can now be found in the care plan section) Acute Rehab PT Goals Patient Stated Goal: to go home by monday PT Goal Formulation: With patient Time For Goal Achievement: 01/28/14 Potential to Achieve Goals: Good    Frequency  7X/week    PT Plan      Co-evaluation  End of Session Equipment Utilized During Treatment: Gait belt;Left knee immobilizer Activity Tolerance: Patient limited by pain Patient left: in bed;with call bell/phone within reach     Time: 0920-0950 PT Time Calculation (min) (ACUTE ONLY): 30 min  Charges:  $Therapeutic Exercise: 8-22 mins $Therapeutic Activity: 8-22 mins                    G Codes:      [name] L 01/22/2014, 9:59 AM

## 2014-01-22 NOTE — Progress Notes (Signed)
Orthopedic Tech Progress Note Patient Details:  Elmo PuttMillard R Mezquita 04-Jan-1951 [id number]  Patient ID: Elmo PuttMillard R Romanello, male   DOB: 04-Jan-1951, 64 y.o.   MRN: [id number] Placed pt's lle in cpm @ 0-70 degrees; will increase as pt tolerates; RN notified  Nikki DomCrawford, Terisa Belardo 01/22/2014, 2:40 PM

## 2014-01-22 NOTE — Op Note (Signed)
[name], [name]               ACCOUNT NO.:  [id number]  MEDICAL RECORD NO.:  [id number]  LOCATION:  5N04C                        FACILITY:  [facility]  PHYSICIAN:  Almedia Balls. Ranell Patrick, M.D. DATE OF BIRTH:  1950/05/15  DATE OF PROCEDURE:  01/21/2014 DATE OF DISCHARGE:                              OPERATIVE REPORT   PREOPERATIVE DIAGNOSIS:  Left knee end-stage osteoarthritis.  POSTOPERATIVE DIAGNOSIS:  Left knee end-stage osteoarthritis.  PROCEDURE PERFORMED:  Left total knee replacement using DePuy Sigma rotating platform prosthesis.  ATTENDING SURGEON:  Almedia Balls. Ranell Patrick, MD.  ASSISTANT:  Donnie Coffin. Dixon, PA-C who scrubbed the entire procedure and necessary for satisfactory completion of surgery.  ANESTHESIA USED:  Femoral block.  ESTIMATED BLOOD LOSS:  Minimal.  FLUID REPLACEMENT:  1200 mL of crystalloids.  INSTRUMENT COUNTS:  Correct.  COMPLICATIONS:  There were no complications.  ANTIBIOTICS:  Perioperative antibiotics were given.  INDICATIONS:  The patient is a 64 year old male who presents with worsening left medial and lateral knee pain.  The patient has had progressive pain despite conservative management.  The patient has bone- on-bone arthritis on x-ray and was only able to walk about a block prior to having to sit down due to pain.  The patient has failed all measures of conservative management including injections, modification of activity, anti-inflammatories.  The patient presents now for operative total knee arthroplasty to restore function and limit pain to his left knee.  Informed consent was obtained.  DESCRIPTION OF PROCEDURE:  After an adequate level of anesthesia was achieved, the patient was positioned supine on the operating room table. Left leg correctly identified and a nonsterile tourniquet placed on the left proximal thigh.  Left leg was sterilely prepped and draped in usual manner.  Time-out was called.  The patient had significant  varus deformity to the knee, but could get it to near full extension.  We went ahead and elevated the leg and exsanguinated using Esmarch bandage, and elevated the tourniquet to 350 mmHg.  The patient's knee was placed in flexion.  Longitudinal midline incision was created with a 10-blade scalpel.  Dissection down through subcutaneous tissues using a 10-blade. We identified the median parapatellar tissue and performed a medial parapatellar arthrotomy with a fresh 10-blade scalpel.  Patella was everted, lateral patellofemoral ligaments divided, distal femur entered with a step-cut drill.  Distal femoral resection performed with an intramedullary guide set on 10 mm 5 degrees left.  Once the distal femur was resected, we sized the femur to a size 5 anterior down, performed our anterior, posterior, and chamfer cuts with a 4:1 block.  We then removed ACL, PCL, meniscal tissues, subluxed tibia anteriorly, placed retractors, then went ahead and resected our tibia 90 degrees perpendicular long axis of the tibia with minimal posterior slope to this posterior cruciate substituting prosthesis.  Once we had our tibial resection, we placed a laminar spreader and removed excess posterior osteophytes off the femur and also capsule from the notch.  We then went ahead and checked our gaps, which were symmetric at 12.5, both in flexion/extension.  We removed our tibial pins, finished our tibial preparation with the modular drill and keel punch for the size 5  tibia. Carefully externally rotated the component to make sure that we internally rotated relatively the tibial tubercle for patellar tracking. Next, we went ahead and performed our box cut with the box cut guide on the femur for the posterior cruciate substituting femoral prosthesis. Once we did that with the oscillating saw, we went ahead and impacted the size 5 left femur in place and the 12.5 poly trial and reduced the knee.  Replaced the soft  tissue balance, we felt we could get the 15 in. We then resurfaced the patella going from 27 mm thickness down to about 17 and then drilled lugs for the 38 patella.  I placed the 38 patella trial in place and ranged the knee.  We had nice patellar tracking.  No hand technique.  We thoroughly irrigated and removed all trial components, pulse irrigating the bone, and then drying thoroughly.  We used a small drill bit to drill some hard bone on the medial side of the tibia.  Next, we went ahead and using DePuy high viscosity cement, cemented all components in place, including tibia, femur, and patella. We used a patellar clamp and then placed the knee in extension with a 12.5 polyethylene insert, and allowed the cement to harden.  Excess cement removed using osteotome and we inspected the posterior aspect of the knee as well.  We then trialed with a size 15 trial and we were happy with the inability come to extension and also with soft tissue balancing in flexion.  We removed the trial components, selected the real size 15 and placed that polyethylene in place, snapped the knee in place with a nice good snap medially.  Again pleased with our soft tissue balancing and patellar tracking, we then thoroughly irrigated the knee with pulse irrigator and repaired the medial parapatellar arthrotomy with #1 Vicryl suture and then 0 and 2-0 Vicryl layered subcutaneous closure and 4-0 Monocryl for skin.  Steri-Strips applied followed by sterile dressing.  The patient tolerated the surgery well.     Almedia BallsSteven R. Ranell PatrickNorris, M.D.     SRN/MEDQ  D:  01/21/2014  T:  01/22/2014  Job:  [id number]

## 2014-01-22 NOTE — Progress Notes (Signed)
PT Cancellation Note  Patient Details Name: Elmo PuttMillard R Cookston MRN: [id number] DOB: Jul 25, 1950   Cancelled Treatment:    Reason Eval/Treat Not Completed: Pain limiting ability to participate;Patient declined, no reason specified.  Patient reporting he is not up to therapy, Tech arrived to apply CPM, defer PT treatment this afternoon in lieu of CPM usage due to low tolerance. Continue as able tomorrow.   Freida BusmanAllen, Deretha Ertle L 01/22/2014, 2:38 PM

## 2014-01-23 LAB — CBC
HEMATOCRIT: 36.1 % — AB (ref 39.0–52.0)
Hemoglobin: 11.5 g/dL — ABNORMAL LOW (ref 13.0–17.0)
MCH: 29.3 pg (ref 26.0–34.0)
MCHC: 31.9 g/dL (ref 30.0–36.0)
MCV: 92.1 fL (ref 78.0–100.0)
Platelets: 180 10*3/uL (ref 150–400)
RBC: 3.92 MIL/uL — ABNORMAL LOW (ref 4.22–5.81)
RDW: 15.3 % (ref 11.5–15.5)
WBC: 13.2 10*3/uL — ABNORMAL HIGH (ref 4.0–10.5)

## 2014-01-23 LAB — PROTIME-INR
INR: 1.5 — ABNORMAL HIGH (ref 0.00–1.49)
PROTHROMBIN TIME: 18.2 s — AB (ref 11.6–15.2)

## 2014-01-23 MED ORDER — WARFARIN SODIUM 7.5 MG PO TABS
7.5000 mg | ORAL_TABLET | Freq: Every day | ORAL | Status: DC
  Administered 2014-01-23: 7.5 mg via ORAL
  Filled 2014-01-23 (×2): qty 1

## 2014-01-23 NOTE — Progress Notes (Signed)
Physical Therapy Treatment Patient Details Name: [name] R Eggebrecht MRN: [id number] DOB: 1950/02/20 Today's Date: 01/23/2014    History of Present Illness pt is a 64 y.o. male s/p Lt TKA. Hx HTN, MI s/p pacemaker, cervical spine degenerative disease, per patient, arthritis in both hips and knees, as well as back pain chronically.    PT Comments    Noted pt a little bit more able to participate this afternoon, at least with the act of getting up and getting back to bed; Does not tolerate movement in his L knee, and yelled to stop therex (after 5 reps of each exercise)  Continue to recommend SNF for post-acute therapies to maximize independence and safety with mobility prior to dc home  Follow Up Recommendations  SNF;Supervision/Assistance - 24 hour     Equipment Recommendations  Rolling walker with 5" wheels;3in1 (PT)    Recommendations for Other Services OT consult     Precautions / Restrictions Precautions Precautions: Fall;Knee Precaution Comments: Pt educated to not allow any pillow or bolster under knee for healing with optimal range of motion.  Required Braces or Orthoses: Knee Immobilizer - Left Knee Immobilizer - Left: On when out of bed or walking Restrictions Weight Bearing Restrictions: Yes LLE Weight Bearing: Weight bearing as tolerated    Mobility  Bed Mobility Overal bed mobility: Needs Assistance Bed Mobility: Sit to Supine       Sit to supine: Total assist;+2 for physical assistance   General bed mobility comments: Pt lethargic; was sitting EOB with heavily antalgic R lean; cued to lean shoulders to the left toward the HOB< but ultimately required total (A) using flat helicopter spin to return to supine.   Transfers Overall transfer level: Needs assistance Equipment used: Rolling walker (2 wheeled) Transfers: Sit to/from Stand Sit to Stand: Mod assist;+2 safety/equipment Stand pivot transfers: +2 physical assistance;Max assist       General transfer  comment: VC's for sequencing. VC's to maintain arousal. Pt unable to pivot full distance and required therapists to pull bed over to pt before he sat.   Ambulation/Gait Ambulation/Gait assistance: +2 physical assistance;Mod assist Ambulation Distance (Feet): 2 Feet Assistive device: Rolling walker (2 wheeled) Gait Pattern/deviations: Decreased step length - right     General Gait Details: Does not accept any weight onto LLE, making advancement of RLE very difficult; Cues for sequence, and to weight shift into stance (on L and Right)   Stairs            Wheelchair Mobility    Modified Rankin (Stroke Patients Only)       Balance Overall balance assessment: Needs assistance Sitting-balance support: Single extremity supported;Feet supported Sitting balance-Leahy Scale: Fair Sitting balance - Comments: heavy antalgic R lean   Standing balance support: Bilateral upper extremity supported;During functional activity Standing balance-Leahy Scale: Poor Standing balance comment: pt unable to remove UE to manage hand held urinal.                     Cognition Arousal/Alertness: Lethargic;Suspect due to medications Behavior During Therapy: [facility] IncWFL for tasks assessed/performed Overall Cognitive Status: Impaired/Different from baseline Area of Impairment: Safety/judgement         Safety/Judgement: Decreased awareness of deficits          Exercises Total Joint Exercises Ankle Circles/Pumps:  (prioritized functional mobility this session) Quad Sets: AROM;AAROM;Left;5 reps Heel Slides: AAROM;Left;5 reps (yelled in pain, so limited to 5 reps) Straight Leg Raises: PROM;Left;5 reps Goniometric ROM: approx 8-70 deg  General Comments        Pertinent Vitals/Pain Pain Assessment: Faces Faces Pain Scale: Hurts whole lot Pain Location: L knee, especially with therex Pain Descriptors / Indicators: Grimacing Pain Intervention(s): Limited activity within patient's  tolerance;Monitored during session;Premedicated before session;Repositioned    Home Living Family/patient expects to be discharged to:: Skilled nursing facility                    Prior Function Level of Independence: Independent with assistive device(s)      Comments: ambulates with SPC at all times    PT Goals (current goals can now be found in the care plan section) Acute Rehab PT Goals Patient Stated Goal: did not state PT Goal Formulation: With patient Time For Goal Achievement: 01/28/14 Potential to Achieve Goals: Good Progress towards PT goals: Progressing toward goals (very slowly)    Frequency  7X/week    PT Plan Discharge plan needs to be updated    Co-evaluation   Reason for Co-Treatment: For patient/therapist safety   OT goals addressed during session: ADL's and self-care     End of Session Equipment Utilized During Treatment: Gait belt;Left knee immobilizer Activity Tolerance: Patient limited by pain Patient left: in chair;with call bell/phone within reach     Time: 1347-1420 PT Time Calculation (min) (ACUTE ONLY): 33 min  Charges:  $Gait Training: 8-22 mins $Therapeutic Exercise: 8-22 mins                    G Codes:      Olen Pel 01/23/2014, 3:45 PM  [place] Cl[place], [place]  Acute Rehabilitation Services Pager [phone] Office [phone]

## 2014-01-23 NOTE — Progress Notes (Signed)
ANTICOAGULATION CONSULT NOTE Pharmacy Consult for Coumadin Indication: VTE prophylaxis  No Known Allergies  Patient Measurements: Height: 5\' 8"  (172.7 cm) Weight: 299 lb (135.626 kg) IBW/kg (Calculated) : 68.4  Vital Signs: Temp: 98.7 F (37.1 C) (01/10 0603) Temp Source: Oral (01/10 0603) BP: 116/69 mmHg (01/10 0938) Pulse Rate: 87 (01/10 0938)  Labs:  Recent Labs  01/21/14 1425 01/22/14 0440 01/23/14 0425  HGB 15.4 12.9* 11.5*  HCT 48.4 41.5 36.1*  PLT 268 212 180  LABPROT  --  14.0 18.2*  INR  --  1.07 1.50*  CREATININE 0.99 0.84  --     Estimated Creatinine Clearance: 122.9 mL/min (by C-G formula based on Cr of 0.84).  Assessment: 64 year old male s/p TKA continues on  Coumadin for VTE prophylaxis INR today = 1.5 (trending up) No bleeding noted  Goal of Therapy:  INR 2-3 Monitor platelets by anticoagulation protocol: Yes   Plan:  Decrease Coumadin to 7.5 mg po daily Lovenox 30 mg sq Q 12 hours Daily INR  Thank you. Okey RegalLisa Jessye Imhoff, PharmD [phone] 01/23/2014,11:05 AM

## 2014-01-23 NOTE — Progress Notes (Signed)
Case Manager spoke with [name].She is aware Of PT recommendation for SNF.No further CM needs at this time.,

## 2014-01-23 NOTE — Clinical Social Work Psychosocial (Signed)
Clinical Social Work Department BRIEF PSYCHOSOCIAL ASSESSMENT 01/23/2014  Patient:  [name], [name]     Account Number:  [id number]     Admit date:  01/21/2014  Clinical Social Worker:  Hubert Azure  Date/Time:  01/23/2014 05:27 PM  Referred by:  Physician  Date Referred:  01/23/2014 Referred for  SNF Placement   Other Referral:   Interview type:  Patient Other interview type:    PSYCHOSOCIAL DATA Living Status:  FAMILY Admitted from facility:   Level of care:   Primary support name:  Charyl Bigger (ex-wife) Primary support relationship to patient:  FAMILY Degree of support available:   Good    CURRENT CONCERNS Current Concerns  Post-Acute Placement   Other Concerns:    SOCIAL WORK ASSESSMENT / PLAN CSW met with patient who was alert and oriented. CSW introduced self and explained role. CSW explained SNF placement process and discussed d/c plan. Per patient, he resides with ex-wife Caren Griffins and his 6 children. Patient is agreeable to SNF placement and has no preference. Patient states he needs the therapy.   Assessment/plan status:  Other - See comment Other assessment/ plan:   CSW to submit PASARR and complete FL2 for placement.   Information/referral to community resources:    PATIENT'S/FAMILY'S RESPONSE TO PLAN OF CARE: Patient was cooperative and agreeable to SNF placement.   [place], [place] Weekend Clinical Social Worker [phone]

## 2014-01-23 NOTE — Progress Notes (Signed)
Physical Therapy Treatment Patient Details Name: [name] MRN: [id number] DOB: 1950-02-23 Today's Date: [name]    History of Present Illness pt is a 64 y.o. male s/p Lt TKA. Hx HTN, MI s/p pacemaker, cervical spine degenerative disease, per patient, arthritis in both hips and knees, as well as back pain chronically.    PT Comments    LLE pain and decreased weight acceptance into stance, coupled with decreased alertness, is significantly affecting functional mobility; Currently requires 2 person assist for safe transfers;  At current functional status, he cannot dc home safely (he told me today that his daughter is unable to give 24 hour assist); he will need post-acute rehab therapies at SNF level to maximize independence and safety with mobility prior to dc home  Follow Up Recommendations  SNF;Supervision/Assistance - 24 hour     Equipment Recommendations  Rolling walker with 5" wheels;3in1 (PT)    Recommendations for Other Services OT consult     Precautions / Restrictions Precautions Precautions: Fall;Knee Precaution Comments: Pt educated to not allow any pillow or bolster under knee for healing with optimal range of motion.  Required Braces or Orthoses: Knee Immobilizer - Left Knee Immobilizer - Left: On when out of bed or walking Restrictions LLE Weight Bearing: Weight bearing as tolerated    Mobility  Bed Mobility Overal bed mobility: Needs Assistance Bed Mobility: Supine to Sit     Supine to sit: +2 for physical assistance;Mod assist     General bed mobility comments: (A) to advance Lt LE to/off EOB; heavily using handrails; cues for sequencing  Transfers Overall transfer level: Needs assistance Equipment used: Rolling walker (2 wheeled) Transfers: Sit to/from Stand Sit to Stand: Mod assist;+2 safety/equipment         General transfer comment: Cues for technique; Tactile cueing, espeically to initiate with anterior lean; benefits from counting  to three to prep for getting up  Ambulation/Gait Ambulation/Gait assistance: +2 physical assistance;Mod assist Ambulation Distance (Feet): 3 Feet (during 2 bouts of working on gait) Assistive device: Rolling walker (2 wheeled) Gait Pattern/deviations: Decreased step length - left;Antalgic;Trunk flexed     General Gait Details: Does not accept any weight onto LLE, making advancement of RLE very difficult; Cues for sequence, and to weight shift into stance (on L and Right)   Stairs            Wheelchair Mobility    Modified Rankin (Stroke Patients Only)       Balance                                    Cognition Arousal/Alertness: Lethargic;Suspect due to medications Behavior During Therapy: [facility] for tasks assessed/performed Overall Cognitive Status: Impaired/Different from baseline Area of Impairment: Safety/judgement         Safety/Judgement: Decreased awareness of deficits     General Comments: Pt says he will be going home, however given his current significantly decreased  functional level that is unsustainable    Exercises Total Joint Exercises Ankle Circles/Pumps:  (prioritized functional mobility this session)    General Comments        Pertinent Vitals/Pain Pain Assessment: Faces Faces Pain Scale: Hurts whole lot Pain Location: Lt knee, especially with weight bearing Pain Descriptors / Indicators: Grimacing Pain Intervention(s): Monitored during session;Premedicated before session    Home Living  Prior Function            PT Goals (current goals can now be found in the care plan section) Acute Rehab PT Goals Patient Stated Goal: did not state Time For Goal Achievement: 01/28/14 Potential to Achieve Goals: Good Progress towards PT goals: Progressing toward goals (Extremely slowly)    Frequency  7X/week    PT Plan Discharge plan needs to be updated    Co-evaluation             End of  Session Equipment Utilized During Treatment: Gait belt;Left knee immobilizer Activity Tolerance: Patient limited by pain Patient left: in chair;with call bell/phone within reach     Time: [id number] PT Time Calculation (min) (ACUTE ONLY): 29 min  Charges:  $Gait Training: 8-22 mins $Therapeutic Activity: 8-22 mins                    G Codes:      [name], [name], [name]  [name], PT  Acute Rehabilitation Services Pager [phone] Office [phone]

## 2014-01-23 NOTE — Clinical Social Work Placement (Addendum)
Clinical Social Work Department CLINICAL SOCIAL WORK PLACEMENT NOTE 01/23/2014  Patient:  [name],[name]  Account Number:  [id number] Admit date:  01/21/2014  Clinical Social Worker:  Vivi Barrackrystal Patrick-jefferson, LCSWA  Date/time:  01/23/2014 05:33 PM  Clinical Social Work is seeking post-discharge placement for this patient at the following level of care:   SKILLED NURSING   (*CSW will update this form in Epic as items are completed)   01/23/2014  Patient/family provided with [facility] Department of Clinical Social Work's list of facilities offering this level of care within the geographic area requested by the patient (or if unable, by the patient's family).  01/23/2014  Patient/family informed of their freedom to choose among providers that offer the needed level of care, that participate in Medicare, Medicaid or managed care program needed by the patient, have an available bed and are willing to accept the patient.  01/23/2014  Patient/family informed of [facility]' ownership interest in [facility], as well as of the fact that they are under no obligation to receive care at this facility.  PASARR submitted to EDS on 01/23/2014 PASARR number received on 01/23/2014  FL2 transmitted to all facilities in geographic area requested by pt/family on  01/23/2014 FL2 transmitted to all facilities within larger geographic area on 01/23/2014  Patient informed that his/her managed care company has contracts with or will negotiate with  certain facilities, including the following:     Patient/family informed of bed offers received:  01/24/2014 Patient chooses bed at [facility] [place]  Physician recommends and patient chooses bed at  n/a  Patient to be transferred to  [facility] [place] Patient to be transferred to facility by PTAR Patient and family notified of transfer on 01/26/2014 Name of family member notified:  Patient is alert and oriented and  states he has not support.  He will update his daughter per his request.  The following physician request were entered in Epic:   Additional Comments: 01/26/2014- Patient is now agreeable to SNF and will be dc'd [facility] [place] [facility] today at 2pm (per SNF request)  1/11/2016t refuses SNF placement.  Patient requests to go home.  CSW has completed PTAR packet and placed on chart for possible discharge home today.  Patient states he does not have a key into his home and will need to wait until his daughter gets off work before arriving home.  This is approximately 6pm. Vickii Penna(Gina Rayson Rando, Theresia MajorsLCSWA 01/24/2014)  Vivi Barrackrystal Patrick-Jefferson, LCSWA Weekend Clinical Social Worker [phone]

## 2014-01-23 NOTE — Progress Notes (Signed)
   Subjective: 2 Days Post-Op Procedure(s) (LRB): LEFT TOTAL KNEE ARTHROPLASTY (Left)  Pt c/o moderate pain in the left knee Therapy has been hard Patient reports pain as moderate.  Objective:   VITALS:   Filed Vitals:   01/23/14 0603  BP: 108/53  Pulse: 86  Temp: 98.7 F (37.1 C)  Resp: 18    Left knee incision healing Mild drainage Dressing changed nv intact distally with minimal edema  LABS  Recent Labs  01/21/14 1425 01/22/14 0440 01/23/14 0425  HGB 15.4 12.9* 11.5*  HCT 48.4 41.5 36.1*  WBC 18.4* 12.2* 13.2*  PLT 268 212 180     Recent Labs  01/21/14 1425 01/22/14 0440  NA  --  138  K  --  4.6  BUN  --  11  CREATININE 0.99 0.84  GLUCOSE  --  107*     Assessment/Plan: 2 Days Post-Op Procedure(s) (LRB): LEFT TOTAL KNEE ARTHROPLASTY (Left) Dressing change today Continue PT/OT D/c planning     Alphonsa OverallBrad Arcadia Gorgas, MPAS, PA-C  01/23/2014, 7:34 AM

## 2014-01-23 NOTE — Evaluation (Signed)
Occupational Therapy Evaluation Patient Details Name: [name] MRN: [id number] DOB: December 13, 1950 Today's Date: 01/23/2014    History of Present Illness pt is a 64 y.o. male s/p Lt TKA. Hx HTN, MI s/p pacemaker, cervical spine degenerative disease, per patient, arthritis in both hips and knees, as well as back pain chronically.   Clinical Impression   PTA pt lived at home and was independent with use of SPC. Pt currently lethargic and limited by pain in LLE. Pt is limited with functional mobility and requires +2 Mod/Max (A) for transfers at this time. Pt also requires total (A) for LB ADLs and would benefit from SNF prior to d/c home. Pt will benefit from acute OT to address functional mobility and ADLs.     Follow Up Recommendations  SNF;Supervision/Assistance - 24 hour    Equipment Recommendations  Other (comment) (pt has DME in room)    Recommendations for Other Services       Precautions / Restrictions Precautions Precautions: Fall;Knee Required Braces or Orthoses: Knee Immobilizer - Left Knee Immobilizer - Left: On when out of bed or walking Restrictions Weight Bearing Restrictions: Yes LLE Weight Bearing: Weight bearing as tolerated      Mobility Bed Mobility Overal bed mobility: Needs Assistance Bed Mobility: Sit to Supine     Supine to sit: +2 for physical assistance;Mod assist Sit to supine: Total assist;+2 for physical assistance   General bed mobility comments: Pt lethargic and required total (A) using flat helicopter spin to return to supine.   Transfers Overall transfer level: Needs assistance Equipment used: Rolling walker (2 wheeled) Transfers: Sit to/from [employer] Sit to Stand: Mod assist;+2 physical assistance Stand pivot transfers: +2 physical assistance;Max assist       General transfer comment: VC's for sequencing. VC's to maintain arousal. Pt unable to pivot full distance and required therapists to pull bed over to pt  before he sat.     Balance Overall balance assessment: Needs assistance Sitting-balance support: Single extremity supported;Feet supported Sitting balance-Leahy Scale: Fair     Standing balance support: Bilateral upper extremity supported;During functional activity Standing balance-Leahy Scale: Poor Standing balance comment: pt unable to remove UE to manage hand held urinal.                             ADL Overall ADL's : Needs assistance/impaired Eating/Feeding: Independent;Sitting   Grooming: Set up;Sitting   Upper Body Bathing: Minimal assitance;Sitting   Lower Body Bathing: Total assistance;Sit to/from stand;+2 for physical assistance   Upper Body Dressing : Moderate assistance;Sitting   Lower Body Dressing: Total assistance;+2 for physical assistance;Sit to/from stand   Toilet Transfer: Moderate assistance;+2 for physical assistance;Stand-pivot;RW Toilet Transfer Details (indicate cue type and reason): recliner>bed Toileting- Clothing Manipulation and Hygiene: Total assistance;Sit to/from stand Toileting - Clothing Manipulation Details (indicate cue type and reason): pt required (A) to hold urinal to urinate due to need for UE support on RW.       General ADL Comments: Pt is limited by lethargy and pain. Pt having difficulty with any mobility and do not feel that d/c home would be safe.      Vision  Pt wears glasses and reports no change from baseline.                    Perception Perception Perception Tested?: No   Praxis Praxis Praxis tested?: Within functional limits    Pertinent Vitals/Pain Pain Assessment:  Faces Faces Pain Scale: Hurts whole lot Pain Location: Lt knee, especially with WB Pain Descriptors / Indicators: Grimacing Pain Intervention(s): Monitored during session;Limited activity within patient's tolerance;Repositioned     Hand Dominance     Extremity/Trunk Assessment Upper Extremity Assessment Upper Extremity  Assessment: Overall WFL for tasks assessed   Lower Extremity Assessment Lower Extremity Assessment: Defer to PT evaluation   Cervical / Trunk Assessment Cervical / Trunk Assessment: Normal   Communication Communication Communication: No difficulties   Cognition Arousal/Alertness: Lethargic;Suspect due to medications [facility] tasks assessed/performed Overall Cognitive Status: Difficult to assess Area of Impairment: Safety/judgement         Safety/Judgement: Decreased awareness of deficits     General Comments: Pt says he will be going home, however given his current significantly decreased  functional level that is unsustainable              Home Living Family/patient expects to be discharged to:: Skilled nursing facility                                        Prior Functioning/Environment Level of Independence: Independent with assistive device(s)        Comments: ambulates with SPC at all times     OT Diagnosis: Generalized weakness;Acute pain   OT Problem List: Decreased strength;Decreased range of motion;Decreased activity tolerance;Impaired balance (sitting and/or standing);Decreased safety awareness;Decreased knowledge of use of DME or AE;Decreased knowledge of precautions;Pain   OT Treatment/Interventions: Self-care/ADL training;Therapeutic exercise;Energy conservation;DME and/or AE instruction;Therapeutic activities;Patient/family education;Balance training    OT Goals(Current goals can be found in the care plan section) Acute Rehab OT Goals Patient Stated Goal: did not state OT Goal Formulation: With patient Time For Goal Achievement: 02/06/14 Potential to Achieve Goals: Good ADL Goals Pt Will Perform Grooming: with min assist;standing Pt Will Transfer to Toilet: with min assist;stand pivot transfer;bedside commode Pt Will Perform Toileting - Clothing Manipulation and hygiene: with mod assist;sit to/from stand  OT  Frequency: Min 2X/week           Co-evaluation PT/OT/SLP Co-Evaluation/Treatment: Yes Reason for Co-Treatment: For patient/therapist safety   OT goals addressed during session: ADL's and self-care      End of Session Equipment Utilized During Treatment: Gait belt;Rolling walker;Left knee immobilizer CPM Left Knee CPM Left Knee: On Left Knee Flexion (Degrees): 40 Left Knee Extension (Degrees): 0  Activity Tolerance: Patient limited by lethargy;Patient limited by pain Patient left: in bed;with call bell/phone within reach   Time: 1347-1406 OT Time Calculation (min): 19 min Charges:  OT General Charges $OT Visit: 1 Procedure OT Evaluation $Initial OT Evaluation Tier I: 1 Procedure G-Codes:    Rae LipsMiller, Shenise Wolgamott M 01/23/2014, 2:35 PM  Carney LivingLeeAnn Marie Jaimeson Gopal, OTR/L Occupational Therapist [phone] (pager)

## 2014-01-24 ENCOUNTER — Encounter (HOSPITAL_COMMUNITY): Payer: Self-pay | Admitting: Orthopedic Surgery

## 2014-01-24 LAB — CBC
HCT: 33.4 % — ABNORMAL LOW (ref 39.0–52.0)
HEMOGLOBIN: 10.5 g/dL — AB (ref 13.0–17.0)
MCH: 29 pg (ref 26.0–34.0)
MCHC: 31.4 g/dL (ref 30.0–36.0)
MCV: 92.3 fL (ref 78.0–100.0)
Platelets: 154 10*3/uL (ref 150–400)
RBC: 3.62 MIL/uL — AB (ref 4.22–5.81)
RDW: 15.1 % (ref 11.5–15.5)
WBC: 10.8 10*3/uL — ABNORMAL HIGH (ref 4.0–10.5)

## 2014-01-24 LAB — PROTIME-INR
INR: 3.32 — ABNORMAL HIGH (ref 0.00–1.49)
PROTHROMBIN TIME: 34 s — AB (ref 11.6–15.2)

## 2014-01-24 MED ORDER — WARFARIN SODIUM 5 MG PO TABS: 5.0000 mg | ORAL_TABLET | Freq: Every day | ORAL | Status: DC

## 2014-01-24 NOTE — Clinical Social Work Note (Signed)
PT is recommending SNF at time of dc.  Patient refusing SNF 1) patient is a smoker and wishes to go home where he can freely smoke.  CSW reviewed medical recommendations with patient.  Patient acknowledged understanding.  2) Patient has Medicaid and [facility] [facility]  Patient is not agreeable to placement out of county and continues to request to go home.  Patient has steps to get into his home and is requesting EMS transportation home.  CSW confirmed address.  Patient will contact his daughter to ensure a key is available for him when he arrives home.  EMS packet complete.  [name], LCSWA [phone]  Psychiatric & Orthopedics (5N 1-16) Clinical Social Worker

## 2014-01-24 NOTE — Progress Notes (Deleted)
CARE MANAGEMENT NOTE 01/24/2014  Patient:  [name],[name]   Account Number:  [id number]  Date Initiated:  01/22/2014  Documentation initiated by:  Inocente Krach  Subjective/Objective Assessment:   dehiscense of left BKA, s/p left AKA     Action/Plan:   PT eval-recommended HHPT   Anticipated DC Date:  01/24/2014   Anticipated DC Plan:  HOME W HOME HEALTH SERVICES  In-house referral  Clinical Social Worker      DC Planning Services  CM consult      PAC Choice  HOME HEALTH   Choice offered to / List presented to:  C-1 Patient        HH arranged  HH-2 PT      HH agency  [facility]   Status of service:  Completed, signed off Medicare Important Message given?  YES (If response is "NO", the following Medicare IM given date fields will be blank) Date Medicare IM given:  01/24/2014 Medicare IM given by:  Sharonna Vinje Date Additional Medicare IM given:   Additional Medicare IM given by:    Discharge Disposition:  HOME W HOME HEALTH SERVICES  Per UR Regulation:    If discussed at Long Length of Stay Meetings, dates discussed:    Comments:  01/24/14 Spoke with patient and then Miranda at [facility]. [facility] for [facility]PT and they will resume services.Patient verified that he has no equipment needs. Cierra Rothgeb RN, BSN, CCM   1.9.2016.Tara Scotland RN,BSN case management.  Role of Case Manager explained to patient at bedside.Patient demographics verified in EPIC.Patient reports he resides with his brother at home and reports he has no DME needs( Patient has a wheelchair ,walker, and bedside commode At home.Patient reports to this CM if he has home health orders he would like [facility]. CM will continue to follow Discharge plan.  

## 2014-01-24 NOTE — Progress Notes (Signed)
   Subjective: 3 Days Post-Op Procedure(s) (LRB): LEFT TOTAL KNEE ARTHROPLASTY (Left)  Pt c/o moderate to severe pain Has been hard to work with therapy due to the pain Plan for snf after discharge Patient reports pain as severe.  Objective:   VITALS:   Filed Vitals:   01/24/14 0537  BP: 127/54  Pulse: 87  Temp: 99 F (37.2 C)  Resp: 18    Left knee incision healing well nv intact distally Trace edema bilateral lower legs No rashes  LABS  Recent Labs  01/22/14 0440 01/23/14 0425 01/24/14 0425  HGB 12.9* 11.5* 10.5*  HCT 41.5 36.1* 33.4*  WBC 12.2* 13.2* 10.8*  PLT 212 180 154     Recent Labs  01/21/14 1425 01/22/14 0440  NA  --  138  K  --  4.6  BUN  --  11  CREATININE 0.99 0.84  GLUCOSE  --  107*     Assessment/Plan: 3 Days Post-Op Procedure(s) (LRB): LEFT TOTAL KNEE ARTHROPLASTY (Left) Plan to d/c to snf once bed available Continue PT/OT Pain control Pulmonary toilet     Alphonsa OverallBrad Tyrie Porzio, MPAS, PA-C  01/24/2014, 9:55 AM

## 2014-01-24 NOTE — Progress Notes (Signed)
Physical Therapy Treatment Patient Details Name: [name] MRN: [id number] DOB: 1950-03-22 Today's Date: 01/24/2014    History of Present Illness pt is a 64 y.o. male s/p Lt TKA. Hx HTN, MI s/p pacemaker, cervical spine degenerative disease, per patient, arthritis in both hips and knees, as well as back pain chronically.    PT Comments    Better participation this afternoon with noted improvements in gait and incr weight acceptance LLE in stance; Continue to recommend SNF for psot-acute rehab to maximize independence and safety with mobility and ADLs (pt incontinent of urine upon arrival this pm)   Follow Up Recommendations  SNF;Supervision/Assistance - 24 hour     Equipment Recommendations  Rolling walker with 5" wheels;3in1 (PT)    Recommendations for Other Services OT consult     Precautions / Restrictions Precautions Precautions: Fall;Knee Precaution Comments: Pt educated to not allow any pillow or bolster under knee for healing with optimal range of motion.  Required Braces or Orthoses: Knee Immobilizer - Left Knee Immobilizer - Left: On when out of bed or walking Restrictions LLE Weight Bearing: Weight bearing as tolerated    Mobility  Bed Mobility Overal bed mobility: Needs Assistance Bed Mobility: Sit to Supine       Sit to supine: Min guard   General bed mobility comments: Cues for technique; slow moving and effortful, with noted valsalva  Transfers Overall transfer level: Needs assistance Equipment used: Rolling walker (2 wheeled) Transfers: Sit to/from Stand Sit to Stand: Min assist         General transfer comment: Cues for safety and hand placement  Ambulation/Gait Ambulation/Gait assistance: Min assist;+2 safety/equipment Ambulation Distance (Feet): 10 Feet Assistive device: Rolling walker (2 wheeled) Gait Pattern/deviations: Step-to pattern     General Gait Details: Improving weight acceptance LLE with much better steps and ble to  walk in room without needing seated rest break   Stairs            Wheelchair Mobility    Modified Rankin (Stroke Patients Only)       Balance     Sitting balance-Leahy Scale: Fair       Standing balance-Leahy Scale: Poor                      Cognition Arousal/Alertness: Awake/alert Behavior During Therapy: WFL for tasks assessed/performed Overall Cognitive Status: Within Functional Limits for tasks assessed                      Exercises      General Comments        Pertinent Vitals/Pain Pain Assessment: 0-10 Pain Score:  ("30") Pain Location: L knee Pain Descriptors / Indicators: Aching;Sore Pain Intervention(s): Premedicated before session    Home Living                      Prior Function            PT Goals (current goals can now be found in the care plan section) Acute Rehab PT Goals Patient Stated Goal: less pain PT Goal Formulation: With patient Time For Goal Achievement: 01/28/14 Potential to Achieve Goals: Good Progress towards PT goals: Progressing toward goals    Frequency  7X/week    PT Plan Current plan remains appropriate    Co-evaluation             End of Session Equipment Utilized During Treatment: Gait belt;Left knee immobilizer Activity Tolerance:  Patient tolerated treatment well;Patient limited by pain Patient left: in bed;in CPM;with call bell/phone within reach     Time: 1507-1528 PT Time Calculation (min) (ACUTE ONLY): 21 min  Charges:  $Gait Training: 8-22 mins                    G Codes:      Olen PelGarrigan, Nubia Ziesmer Hamff 01/24/2014, 4:28 PM  Van ClinesHolly Temitope Flammer, [place]PT  Acute Rehabilitation Services Pager [phone] Office [phone]

## 2014-01-24 NOTE — Progress Notes (Signed)
Physical Therapy Treatment Patient Details Name: [name] R Kibbe MRN: [id number] DOB: 1950-02-11 Today's Date: 01/24/2014    History of Present Illness pt is a 64 y.o. male s/p Lt TKA. Hx HTN, MI s/p pacemaker, cervical spine degenerative disease, per patient, arthritis in both hips and knees, as well as back pain chronically.    PT Comments    Making very slow progress with mobility, though did note improvements in sit to stand; Pain is limiting [name]'s tolerance of walking any distance (went 4 ft, then seated rest break at pt's insistence, then 4 more ft); Max encouragement to increase amb distance -- especially if pt is choosing to go home -- and he still insisted to sit after only 4 ft; we discussed that for him to safely dc home, he must be able to safely and independently manage walking household distances;   At this point, my only logical recommendations for dc planning is SNF for post-acute rehab to maximize independence and safety with mobility prior to dc home.   Follow Up Recommendations  SNF;Supervision/Assistance - 24 hour     Equipment Recommendations  Rolling walker with 5" wheels;3in1 (PT)    Recommendations for Other Services       Precautions / Restrictions Precautions Precautions: Fall;Knee Precaution Comments: Pt educated to not allow any pillow or bolster under knee for healing with optimal range of motion.  Required Braces or Orthoses: Knee Immobilizer - Left Knee Immobilizer - Left: On when out of bed or walking Restrictions Weight Bearing Restrictions: Yes LLE Weight Bearing: Weight bearing as tolerated    Mobility  Bed Mobility Overal bed mobility: Needs Assistance Bed Mobility: Supine to Sit     Supine to sit: Mod assist     General bed mobility comments: Cues for technique; Noted better effort with getting up and much less posterior and R lean (though still a bit present); pulled up to sit with handheld assist  Transfers Overall transfer  level: Needs assistance Equipment used: Rolling walker (2 wheeled) Transfers: Sit to/from Stand Sit to Stand: +2 safety/equipment;Mod assist;Min assist         General transfer comment: Cues for technique and hand placement; Mod assist from bed; min assist from recliner with pt pushing up from both armrests  Ambulation/Gait Ambulation/Gait assistance: +2 physical assistance;+2 safety/equipment;Mod assist Ambulation Distance (Feet): 8 Feet (4+4 with seated rest break) Assistive device: Rolling walker (2 wheeled) Gait Pattern/deviations: Step-to pattern;Decreased step length - right;Decreased step length - left;Antalgic     General Gait Details: Does not accept any weight onto LLE, making advancement of RLE very difficult; Cues for sequence, and to weight shift into stance (on L and Right); Better use of UEs to take weight off of RLE for stepping, however, still with little weight acceptance LLE, and essentially kept LLE NWB   Stairs            Wheelchair Mobility    Modified Rankin (Stroke Patients Only)       Balance     Sitting balance-Leahy Scale: Fair Sitting balance - Comments: heavy antalgic R lean     Standing balance-Leahy Scale: Poor                      Cognition Arousal/Alertness: Awake/alert Behavior During Therapy: WFL for tasks assessed/performed Overall Cognitive Status: Impaired/Different from baseline Area of Impairment: Safety/judgement         Safety/Judgement: Decreased awareness of deficits     General Comments: Pt says  he will be going home, however given his current significantly decreased  functional level that is unsustainable    Exercises Total Joint Exercises Quad Sets: AROM;AAROM;Left;5 reps Short Arc Quad: PROM;Left;5 reps Heel Slides: AAROM;Left;5 reps (yelled in pain, so limited to 5 reps) Straight Leg Raises: PROM;Left;5 reps Long Arc Quad: PROM;Left;5 reps Knee Flexion: PROM;Left;10 reps;Seated Goniometric ROM:  approx 2-70 deg    General Comments        Pertinent Vitals/Pain Pain Assessment: 0-10 Pain Score: 10-Worst pain ever Pain Location: L knee after walking and therex Pain Descriptors / Indicators: Grimacing Pain Intervention(s): Limited activity within patient's tolerance;Monitored during session;Premedicated before session    Home Living                      Prior Function            PT Goals (current goals can now be found in the care plan section) Acute Rehab PT Goals Patient Stated Goal: less pain PT Goal Formulation: With patient Time For Goal Achievement: 01/28/14 Potential to Achieve Goals: Good Progress towards PT goals: Progressing toward goals (extremely slowly)    Frequency  7X/week    PT Plan Current plan remains appropriate    Co-evaluation             End of Session Equipment Utilized During Treatment: Gait belt;Left knee immobilizer Activity Tolerance: Patient limited by pain Patient left: in chair;with call bell/phone within reach     Time: 0911-0945 PT Time Calculation (min) (ACUTE ONLY): 34 min  Charges:  $Gait Training: 8-22 mins $Therapeutic Exercise: 8-22 mins                    G Codes:      Van Clines Hamff 01/24/2014, 10:03 AM  Van Clines, PT  Acute Rehabilitation Services Pager [phone] Office [phone]

## 2014-01-24 NOTE — Progress Notes (Signed)
Talked with patient's daughter [name], she stated that patient can no longer live with her at home and was asked to leave prior to coming to the hospital.  She was letting him sleep on a mattress on the floor and knows this will not work with his knee surgery.  She is taking PT for previous hand surgery and has discussed the patient that she would not be able to take care of him.  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  She states that he needs to go to a SNF.  She also stated that if this is due to his smoking; that while he was in jail for the past 6 months, he did not smoke while there.  Any questions she can be reached at [phone].

## 2014-01-24 NOTE — Progress Notes (Signed)
ANTICOAGULATION CONSULT NOTE Pharmacy Consult for Coumadin Indication: VTE prophylaxis  No Known Allergies  Patient Measurements: Height: 5\' 8"  (172.7 cm) Weight: 299 lb (135.626 kg) IBW/kg (Calculated) : 68.4  Vital Signs: Temp: 99.3 F (37.4 C) (01/11 1241) Temp Source: Oral (01/11 0537) BP: 138/58 mmHg (01/11 1241) Pulse Rate: 81 (01/11 1241)  Labs:  Recent Labs  01/21/14 1425 01/22/14 0440 01/23/14 0425 01/24/14 0425  HGB 15.4 12.9* 11.5* 10.5*  HCT 48.4 41.5 36.1* 33.4*  PLT 268 212 180 154  LABPROT  --  14.0 18.2* 34.0*  INR  --  1.07 1.50* 3.32*  CREATININE 0.99 0.84  --   --     Estimated Creatinine Clearance: 122.9 mL/min (by C-G formula based on Cr of 0.84).  Assessment: 64 year old male s/p TKA continues on  Coumadin for VTE prophylaxis INR big jump to 3.32 No bleeding noted  Goal of Therapy:  INR 2-3 Monitor platelets by anticoagulation protocol: Yes   Plan:  No coumadin today D/c Lovenox Daily INR  Thanks for allowing pharmacy to be a part of this patient's care.  Talbert CageLora Ronit Cranfield, PharmD Clinical Pharmacist, [phone] 01/24/2014,1:48 PM

## 2014-01-24 NOTE — Progress Notes (Signed)
Orthopedics Progress Note  Subjective: Aware of patient's extremely slow progress.  Patient refusing SNF rehab, but now thinking about it.  Will discuss again with him today. I do not think the patient is safe to go home at this point.  Objective:  Filed Vitals:   01/24/14 1241  BP: 138/58  Pulse: 81  Temp: 99.3 F (37.4 C)  Resp: 18    General: Awake and alert  Musculoskeletal: left knee incision with bleeding from the incision site,. No erythema and mod swelling. Neg Homan's  ROM 10-60 degrees Neurovascularly intact  Lab Results  Component Value Date   WBC 10.8* 01/24/2014   HGB 10.5* 01/24/2014   HCT 33.4* 01/24/2014   MCV 92.3 01/24/2014   PLT 154 01/24/2014       Component Value Date/Time   NA 138 01/22/2014 0440   K 4.6 01/22/2014 0440   CL 100 01/22/2014 0440   CO2 27 01/22/2014 0440   GLUCOSE 107* 01/22/2014 0440   BUN 11 01/22/2014 0440   CREATININE 0.84 01/22/2014 0440   CALCIUM 8.2* 01/22/2014 0440   GFRNONAA >90 01/22/2014 0440   GFRAA >90 01/22/2014 0440    Lab Results  Component Value Date   INR 3.32* 01/24/2014   INR 1.50* 01/23/2014   INR 1.07 01/22/2014    Assessment/Plan: POD #3 s/p Procedure(s): LEFT TOTAL KNEE ARTHROPLASTY Patient INR supra therapeutic and with active bleeding at the incision site, holding coumadin tonight.  I would request that he stay in hospital until we get his INR in a safe range and also until he is physically safe to go home or until he agrees to go to SNF rehab. Appreciate PT , OT , SW support Check INR in the AM  SalemSteven R. Ranell PatrickNorris, MD 01/24/2014 2:29 PM

## 2014-01-24 NOTE — Discharge Summary (Signed)
Physician Discharge Summary   Patient ID: [name] MRN: [id number] DOB/AGE: 63-Aug-1952 63 y.o.  Admit date: 01/21/2014 Discharge date: 01/24/2014  Admission Diagnoses:  Active Problems:   S/P knee replacement   Discharge Diagnoses:  Same   Surgeries: Procedure(s): LEFT TOTAL KNEE ARTHROPLASTY on 01/21/2014   Consultants: PT/OT  Discharged Condition: Stable  Hospital Course: [name] is an 64 y.o. male who was admitted 01/21/2014 with a chief complaint of No chief complaint on file. , and found to have a diagnosis of <principal problem not specified>.  They were brought to the operating room on 01/21/2014 and underwent the above named procedures.    The patient had an uncomplicated hospital course and was stable for discharge.  Recent vital signs:  Filed Vitals:   01/24/14 0537  BP: 127/54  Pulse: 87  Temp: 99 F (37.2 C)  Resp: 18    Recent laboratory studies:  Results for orders placed or performed during the hospital encounter of 01/21/14  CBC  Result Value Ref Range   WBC 18.4 (H) 4.0 - 10.5 K/uL   RBC 5.03 4.22 - 5.81 MIL/uL   Hemoglobin 15.4 13.0 - 17.0 g/dL   HCT 40.948.4 81.139.0 - 91.452.0 %   MCV 96.2 78.0 - 100.0 fL   MCH 30.6 26.0 - 34.0 pg   MCHC 31.8 30.0 - 36.0 g/dL   RDW 78.215.1 95.611.5 - 21.315.5 %   Platelets 268 150 - 400 K/uL  Creatinine, serum  Result Value Ref Range   Creatinine, Ser 0.99 0.50 - 1.35 mg/dL   GFR calc non Af Amer 86 (L) >90 mL/min   GFR calc Af Amer >90 >90 mL/min  Glucose, capillary  Result Value Ref Range   Glucose-Capillary 136 (H) 70 - 99 mg/dL   Comment 1 Notify RN   Protime-INR  Result Value Ref Range   Prothrombin Time 14.0 11.6 - 15.2 seconds   INR 1.07 0.00 - 1.49  CBC  Result Value Ref Range   WBC 12.2 (H) 4.0 - 10.5 K/uL   RBC 4.40 4.22 - 5.81 MIL/uL   Hemoglobin 12.9 (L) 13.0 - 17.0 g/dL   HCT 08.641.5 57.839.0 - 46.952.0 %   MCV 94.3 78.0 - 100.0 fL   MCH 29.3 26.0 - 34.0 pg   MCHC 31.1 30.0 - 36.0 g/dL   RDW 62.915.1 52.811.5 -  41.315.5 %   Platelets 212 150 - 400 K/uL  Basic metabolic panel  Result Value Ref Range   Sodium 138 135 - 145 mmol/L   Potassium 4.6 3.5 - 5.1 mmol/L   Chloride 100 96 - 112 mEq/L   CO2 27 19 - 32 mmol/L   Glucose, Bld 107 (H) 70 - 99 mg/dL   BUN 11 6 - 23 mg/dL   Creatinine, Ser 2.440.84 0.50 - 1.35 mg/dL   Calcium 8.2 (L) 8.4 - 10.5 mg/dL   GFR calc non Af Amer >90 >90 mL/min   GFR calc Af Amer >90 >90 mL/min   Anion gap 11 5 - 15  Protime-INR  Result Value Ref Range   Prothrombin Time 18.2 (H) 11.6 - 15.2 seconds   INR 1.50 (H) 0.00 - 1.49  CBC  Result Value Ref Range   WBC 13.2 (H) 4.0 - 10.5 K/uL   RBC 3.92 (L) 4.22 - 5.81 MIL/uL   Hemoglobin 11.5 (L) 13.0 - 17.0 g/dL   HCT 01.036.1 (L) 27.239.0 - 53.652.0 %   MCV 92.1 78.0 - 100.0 fL   MCH  29.3 26.0 - 34.0 pg   MCHC 31.9 30.0 - 36.0 g/dL   RDW 19.1 47.8 - 29.5 %   Platelets 180 150 - 400 K/uL  Protime-INR  Result Value Ref Range   Prothrombin Time 34.0 (H) 11.6 - 15.2 seconds   INR 3.32 (H) 0.00 - 1.49  CBC  Result Value Ref Range   WBC 10.8 (H) 4.0 - 10.5 K/uL   RBC 3.62 (L) 4.22 - 5.81 MIL/uL   Hemoglobin 10.5 (L) 13.0 - 17.0 g/dL   HCT 62.1 (L) 30.8 - 65.7 %   MCV 92.3 78.0 - 100.0 fL   MCH 29.0 26.0 - 34.0 pg   MCHC 31.4 30.0 - 36.0 g/dL   RDW 84.6 96.2 - 95.2 %   Platelets 154 150 - 400 K/uL    Discharge Medications:     Medication List    TAKE these medications        albuterol 108 (90 BASE) MCG/ACT inhaler  Commonly known as:  PROVENTIL HFA;VENTOLIN HFA  Inhale 2 puffs into the lungs every 6 (six) hours as needed for wheezing or shortness of breath.     aspirin EC 81 MG tablet  Take 81 mg by mouth daily.     atorvastatin 10 MG tablet  Commonly known as:  LIPITOR  Take 10 mg by mouth daily.     divalproex 500 MG 24 hr tablet  Commonly known as:  DEPAKOTE ER  Take 2,000 mg by mouth at bedtime.     methocarbamol 500 MG tablet  Commonly known as:  ROBAXIN  Take 1 tablet (500 mg total) by mouth 3 (three)  times daily as needed.     nitroGLYCERIN 0.4 MG SL tablet  Commonly known as:  NITROSTAT  Place 1 tablet (0.4 mg total) under the tongue every 5 (five) minutes as needed for chest pain.     oxyCODONE-acetaminophen 5-325 MG per tablet  Commonly known as:  ROXICET  Take 1-2 tablets by mouth every 4 (four) hours as needed for severe pain.     sertraline 50 MG tablet  Commonly known as:  ZOLOFT  Take 150 mg by mouth every morning.     temazepam 15 MG capsule  Commonly known as:  RESTORIL  Take 15 mg by mouth at bedtime as needed for sleep.     traMADol 50 MG tablet  Commonly known as:  ULTRAM  Take 50 mg by mouth every 6 (six) hours as needed for moderate pain or severe pain.     traZODone 50 MG tablet  Commonly known as:  DESYREL  Take 25-200 mg by mouth at bedtime as needed for sleep.     warfarin 5 MG tablet  Commonly known as:  COUMADIN  Take 1 tablet (5 mg total) by mouth daily.     ZESTORETIC 10-12.5 MG per tablet  Generic drug:  lisinopril-hydrochlorothiazide  Take 1 tablet by mouth daily.        Diagnostic Studies: Dg Knee Left Port  01/21/2014   CLINICAL DATA:  Status post left knee prosthesis placement  EXAM: PORTABLE LEFT KNEE - 1-2 VIEW  COMPARISON:  None.  FINDINGS: A left knee prosthesis is noted. No acute abnormality is seen. Air is noted in the surgical bed.  IMPRESSION: Status post left knee replacement without acute abnormality.   Electronically Signed   By: Alcide Clever M.D.   On: 01/21/2014 13:44    Disposition: 01-Home or Self Care  Follow-up Information    Follow up with NORRIS,STEVEN R, MD. Call in 2 weeks.   Specialty:  Orthopedic Surgery   Why:  [phone]   Contact information:   [address] [place] [address] [phone]       Follow up with [facility].   Why:  Home Health Physical Therapy   Contact information:   [address] [place] [address] [phone]         Signed: Thea Gist 01/24/2014, 9:57 AM

## 2014-01-24 NOTE — Progress Notes (Signed)
01/24/14 Spoke with patient about PT recommending SNF.He is aware that rehab at SNF would be best option due to his progress and limited assistance at home. He is refusing to go to SNF due to not being able to smoke at a SNF. He states that he has a few steps to front door and back door. He  is agreeable to going home via ambulance.He is agreeable to HHPT with [facility]. Sp[facility] with Corrie DandyMary at [facility] and set up HHPT and Child psychotherapistsocial worker in case patient changes his mind about SNF. Patient states that his daughter will be able to come to pick up his rolling walker and 3N1 which have been delivered to his room after she gets off work at 6pm this evening. Patient will need to d/c after daughter is off work this pm, patient's RN aware. Deatra CanterMary Kreg RN, BSN, [facility]        01/21/2014 1530 NCM [facility] to pt and [facility] Pt requesting RW and 3n1 for home. [employer] will deliver DME to pt.  Isidoro DonningAlesia Shavis RN CCM Case Mgmt phone [phone]

## 2014-01-25 ENCOUNTER — Encounter (HOSPITAL_COMMUNITY): Payer: Self-pay | Admitting: General Practice

## 2014-01-25 LAB — PROTIME-INR
INR: 2.82 — AB (ref 0.00–1.49)
PROTHROMBIN TIME: 29.9 s — AB (ref 11.6–15.2)

## 2014-01-25 MED ORDER — WARFARIN SODIUM 4 MG PO TABS
4.0000 mg | ORAL_TABLET | Freq: Once | ORAL | Status: AC
  Administered 2014-01-25: 4 mg via ORAL
  Filled 2014-01-25: qty 1

## 2014-01-25 MED ORDER — WARFARIN SODIUM 4 MG PO TABS: 4.0000 mg | ORAL_TABLET | Freq: Once | ORAL | Status: DC

## 2014-01-25 NOTE — Progress Notes (Signed)
Patient was offered Bisacodyl PR for constipation; however, he refused. LBM 1/7. Will continue to encourage the use of laxatives.

## 2014-01-25 NOTE — Progress Notes (Signed)
Occupational Therapy Treatment Patient Details Name: JAVARIS WIGINGTON MRN: [id number] DOB: Dec 21, 1950 Today's Date: 01/25/2014    History of present illness pt is a 64 y.o. male s/p Lt TKA. Hx HTN, MI s/p pacemaker, cervical spine degenerative disease, per patient, arthritis in both hips and knees, as well as back pain chronically.   OT comments  Pt. Continues with slow progression with therapy but agreeable to participation.  Completed ub/lb bathing with min a sit/stand.  Declined amb. To further assess toileting secondary to just finishing PT session.  Will benefit from continued therapies once d/c to snf.    Follow Up Recommendations  SNF;Supervision/Assistance - 24 hour    Equipment Recommendations       Recommendations for Other Services      Precautions / Restrictions Precautions Precautions: Fall;Knee Required Braces or Orthoses: Knee Immobilizer - Left Knee Immobilizer - Left: On when out of bed or walking Restrictions Weight Bearing Restrictions: Yes LLE Weight Bearing: Weight bearing as tolerated       Mobility Bed Mobility               General bed mobility comments: in recliner upon arrival  Transfers Overall transfer level: Needs assistance Equipment used: Rolling walker (2 wheeled) Transfers: Sit to/from Stand Sit to Stand: Min assist         General transfer comment: Cues for safety and hand placement    Balance                                   ADL Overall ADL's : Needs assistance/impaired     Grooming: Wash/dry hands;Wash/dry face;Applying deodorant;Brushing hair;Set up;Sitting   Upper Body Bathing: Set up;Sitting   Lower Body Bathing: Minimal assistance;Sit to/from stand;Sitting/lateral leans Lower Body Bathing Details (indicate cue type and reason): pt. completed front perineal bathing seated, and was able to to peform buttocks in standing Upper Body Dressing : Set up;Sitting                     General ADL  Comments: pt. had just completed PT and was fatigued, declining further ambulation to assess toileting but agreeable to bathing tasks      Vision                     Perception     Praxis      Cognition   Behavior During Therapy: [facility] for tasks assessed/performed Overall Cognitive Status: Within Functional Limits for tasks assessed                       Extremity/Trunk Assessment               Exercises     Shoulder Instructions       General Comments      Pertinent Vitals/ Pain       Pain Assessment:  (did not rate but moaning throughout session) Pain Intervention(s): Monitored during session;Repositioned  Home Living                                          Prior Functioning/Environment              Frequency Min 2X/week     Progress Toward Goals  OT Goals(current goals can now be found  in the care plan section)  Progress towards OT goals: Progressing toward goals     Plan Discharge plan remains appropriate    Co-evaluation                 End of Session Equipment Utilized During Treatment: Rolling walker CPM Left Knee CPM Left Knee: Off   Activity Tolerance Patient tolerated treatment well   Patient Left in chair;with call bell/phone within reach   Nurse Communication          Time: 0920-1000 OT Time Calculation (min): 40 min  Charges: OT General Charges $OT Visit: 1 Procedure OT Treatments $Self Care/Home Management : 38-52 mins  Robet LeuMorris, Jakerria Kingbird Lorraine, COTA/L 01/25/2014, 10:08 AM

## 2014-01-25 NOTE — Progress Notes (Signed)
ANTICOAGULATION CONSULT NOTE  Pharmacy Consult for Coumadin Indication: VTE prophylaxis  No Known Allergies  Patient Measurements: Height: 5\' 8"  (172.7 cm) Weight: 299 lb (135.626 kg) IBW/kg (Calculated) : 68.4  Vital Signs: Temp: 99.5 F (37.5 C) (01/12 0614) Temp Source: Oral (01/12 0614) BP: 134/57 mmHg (01/12 1006) Pulse Rate: 66 (01/12 0614)  Labs:  Recent Labs  01/23/14 0425 01/24/14 0425 01/25/14 0610  HGB 11.5* 10.5*  --   HCT 36.1* 33.4*  --   PLT 180 154  --   LABPROT 18.2* 34.0* 29.9*  INR 1.50* 3.32* 2.82*    Estimated Creatinine Clearance: 122.9 mL/min (by C-G formula based on Cr of 0.84).  Assessment: 64 year old male s/p TKA who continues on  Coumadin for VTE prophylaxis. INR trended up quickly but appears to be stable now. No bleeding noted.  Goal of Therapy:  INR 2-3 Monitor platelets by anticoagulation protocol: Yes   Plan:  Warfarin 4 mg PO tonight Daily INR Monitor for s/sx of bleeding  [facility] Surgery CenterJennifer Goree, [address] BoulevardPharm.D., BCPS Clinical Pharmacist Pager: [phone] 01/25/2014 1:34 PM

## 2014-01-25 NOTE — Progress Notes (Signed)
Physical Therapy Treatment Patient Details Name: [name] MRN: [id number] DOB: 08/17/1950 Today's Date: 01/25/2014    History of Present Illness pt is a 64 y.o. male s/p Lt TKA. Hx HTN, MI s/p pacemaker, cervical spine degenerative disease, per patient, arthritis in both hips and knees, as well as back pain chronically.    PT Comments    Noting improvements in bed mobility and LLE weight acceptance with amb; Still, needing max encouragement for participation, and does show some self-limiting choices;  Pt tolerated positioning in the bone foam bolster under foot, which is an improvement  Follow Up Recommendations  SNF;Supervision/Assistance - 24 hour     Equipment Recommendations  Rolling walker with 5" wheels;3in1 (PT)    Recommendations for Other Services       Precautions / Restrictions Precautions Precautions: Fall;Knee Precaution Comments: Pt educated to not allow any pillow or bolster under knee for healing with optimal range of motion.  Required Braces or Orthoses: Knee Immobilizer - Left Knee Immobilizer - Left: On when out of bed or walking Restrictions Weight Bearing Restrictions: Yes LLE Weight Bearing: Weight bearing as tolerated    Mobility  Bed Mobility Overal bed mobility: Needs Assistance Bed Mobility: Supine to Sit     Supine to sit: Min guard     General bed mobility comments: Cues for technique; slow moving, with noted valsalva, but did not require physical assist  Transfers Overall transfer level: Needs assistance Equipment used: Rolling walker (2 wheeled) Transfers: Sit to/from Stand Sit to Stand: Min guard         General transfer comment: Cues for safety and hand placement  Ambulation/Gait Ambulation/Gait assistance: Min assist (second person pushing chair behind) Ambulation Distance (Feet): 7 Feet Assistive device: Rolling walker (2 wheeled) Gait Pattern/deviations: Step-to pattern Gait velocity: quite slow Gait velocity  interpretation: Below normal speed for age/gender General Gait Details: Improving weight acceptance LLE with much better steps and ble to walk in room without needing seated rest break; Still, quite resistent to walking further, despite max encouragement   Stairs            Wheelchair Mobility    Modified Rankin (Stroke Patients Only)       Balance             Standing balance-Leahy Scale: Poor                      Cognition Arousal/Alertness: Awake/alert Behavior During Therapy: WFL for tasks assessed/performed Overall Cognitive Status: Within Functional Limits for tasks assessed                 General Comments: Noted in SW note, pt is more agreeable to SNF    Exercises      General Comments        Pertinent Vitals/Pain Pain Assessment: Faces Faces Pain Scale: Hurts even more Pain Location: L knee; pain limited his activity tolerance Pain Descriptors / Indicators: Grimacing Pain Intervention(s): Monitored during session;Premedicated before session    Home Living Family/patient expects to be discharged to:: Other (Comment) (rehab) Living Arrangements: Non-relatives/Friends;Children                  Prior Function            PT Goals (current goals can now be found in the care plan section) Acute Rehab PT Goals Patient Stated Goal: less pain PT Goal Formulation: With patient Time For Goal Achievement: 01/28/14 Potential to Achieve Goals: Good  Progress towards PT goals: Progressing toward goals    Frequency  7X/week    PT Plan Current plan remains appropriate    Co-evaluation             End of Session Equipment Utilized During Treatment: Gait belt;Left knee immobilizer Activity Tolerance: Patient tolerated treatment well;Patient limited by pain Patient left: in chair;with call bell/phone within reach     Time: 0846-0903 PT Time Calculation (min) (ACUTE ONLY): 17 min  Charges:  $Gait Training: 8-22 mins                     G Codes:      Olen Pel 01/25/2014, 10:48 AM  Van Clines, PT  Acute Rehabilitation Services Pager [phone] Office [phone]

## 2014-01-25 NOTE — Clinical Social Work Note (Signed)
Patient has bed available at [facility] in [facility] once medically stable. CSW met with patient to review disposition.  Patient is agreeable.  Nonnie Done, [place] [phone]  Psychiatric & Orthopedics (5N 1-16) Clinical Social Worker

## 2014-01-25 NOTE — Progress Notes (Signed)
Physical Therapy Treatment Patient Details Name: [name] MRN: [id number] DOB: 05-06-1950 Today's Date: 01/25/2014    History of Present Illness pt is a 64 y.o. male s/p Lt TKA. Hx HTN, MI s/p pacemaker, cervical spine degenerative disease, per patient, arthritis in both hips and knees, as well as back pain chronically.    PT Comments    Max encouragement to participate in therex; Noted muscle guarding with flexion range of motion;   Noted overall improvement with bed mobility, and better coordination of movement, and sitting balance and posture to be able to use the urinal sitting EOB;   Refused CPM; ended session with pt's foot propped in bone foam bolster.   Follow Up Recommendations  SNF;Supervision/Assistance - 24 hour     Equipment Recommendations  Rolling walker with 5" wheels;3in1 (PT)    Recommendations for Other Services       Precautions / Restrictions Precautions Precautions: Fall;Knee Precaution Comments: Pt educated to not allow any pillow or bolster under knee for healing with optimal range of motion.  Required Braces or Orthoses: Knee Immobilizer - Left Knee Immobilizer - Left: On when out of bed or walking Restrictions LLE Weight Bearing: Weight bearing as tolerated    Mobility  Bed Mobility Overal bed mobility: Needs Assistance Bed Mobility: Supine to Sit;Sit to Supine     Supine to sit: Min assist Sit to supine: Min assist   General bed mobility comments: Cues for technique; Required assist to reciprocally scoot hips to EOB; Min assist to help LLE back into bed  Transfers                 General transfer comment: Declined any OOB activity  Ambulation/Gait                 Stairs            Wheelchair Mobility    Modified Rankin (Stroke Patients Only)       Balance                                    Cognition Arousal/Alertness: Awake/alert Behavior During Therapy: WFL for tasks  assessed/performed Overall Cognitive Status: Within Functional Limits for tasks assessed                      Exercises Total Joint Exercises Ankle Circles/Pumps: AAROM;AROM;Both;10 reps;Seated Quad Sets: AROM;AAROM;Left;10 reps Short Arc QuadBarbaraann Boys;Left;10 reps Heel Slides: AAROM;Left;10 reps Straight Leg Raises: AAROM;Left;10 reps    General Comments        Pertinent Vitals/Pain Pain Assessment: Faces Faces Pain Scale: Hurts whole lot Pain Location: L knee with flexion therex Pain Descriptors / Indicators: Grimacing Pain Intervention(s): Monitored during session;Repositioned    Home Living                      Prior Function            PT Goals (current goals can now be found in the care plan section) Acute Rehab PT Goals Patient Stated Goal: less pain PT Goal Formulation: With patient Time For Goal Achievement: 01/28/14 Potential to Achieve Goals: Good Progress towards PT goals: Progressing toward goals    Frequency  7X/week    PT Plan Current plan remains appropriate    Co-evaluation             End of Session   Activity  Tolerance: Patient limited by pain Patient left: in bed;with call bell/phone within reach     Time: 1354-1410 PT Time Calculation (min) (ACUTE ONLY): 16 min  Charges:  $Therapeutic Exercise: 8-22 mins                    G Codes:      Olen PelGarrigan, Zyanna Leisinger Hamff 01/25/2014, 2:46 PM  Van ClinesHolly Kazden [place], [place]PT  Acute Rehabilitation Services Pager [phone] Office [phone]

## 2014-01-25 NOTE — Clinical Social Work Note (Signed)
CSW was notified that patient spoke with MD and is now agreeable to SNF placement.  Per RN report, patient can no longer stay with daughter at time of discharge.  Patient's payor source is Medicaid.  Patient will receive SNF/STR under Letter of Guarantee (LOG) from [facility].  CSW explained LOG/SNF process and possibility that placement [place] be [place] of county.  Patient is aware and reluctant to out-of-county placement, but is agreeable for SNF search as home is not a safe discharge.  At this time, no bed offers have been received.  CSW sought counsel from Research scientist (physical sciences)Assistant CSW Director.    Vickii PennaGina Willella Harding, LCSWA [phone]  Psychiatric & Orthopedics (5N 1-16) Clinical Social Worker

## 2014-01-25 NOTE — Care Management (Signed)
Utilization Review completed   Zayah Keilman,RN, BSN,CCM 

## 2014-01-25 NOTE — Discharge Summary (Addendum)
Physician Discharge Summary   Patient ID: [name] MRN: [id number] DOB/AGE: 1950/06/17 64 y.o.  Admit date: 01/21/2014 Discharge date: 01/25/2014  Admission Diagnoses:  Active Problems:   S/P knee replacement   Discharge Diagnoses:  Same   Surgeries: Procedure(s): LEFT TOTAL KNEE ARTHROPLASTY on 01/21/2014   Consultants: PT/OT  Discharged Condition: Stable  Hospital Course: [name] is an 64 y.o. male who was admitted 01/21/2014 with a chief complaint of No chief complaint on file. , and found to have a diagnosis of <principal problem not specified>.  They were brought to the operating room on 01/21/2014 and underwent the above named procedures.    The patient had an uncomplicated hospital course and was stable for discharge.  Recent vital signs:  Filed Vitals:   01/25/14 1200  BP:   Pulse:   Temp:   Resp: 16    Recent laboratory studies:  Results for orders placed or performed during the hospital encounter of 01/21/14  CBC  Result Value Ref Range   WBC 18.4 (H) 4.0 - 10.5 K/uL   RBC 5.03 4.22 - 5.81 MIL/uL   Hemoglobin 15.4 13.0 - 17.0 g/dL   HCT 40.9 81.1 - 91.4 %   MCV 96.2 78.0 - 100.0 fL   MCH 30.6 26.0 - 34.0 pg   MCHC 31.8 30.0 - 36.0 g/dL   RDW 78.2 95.6 - 21.3 %   Platelets 268 150 - 400 K/uL  Creatinine, serum  Result Value Ref Range   Creatinine, Ser 0.99 0.50 - 1.35 mg/dL   GFR calc non Af Amer 86 (L) >90 mL/min   GFR calc Af Amer >90 >90 mL/min  Glucose, capillary  Result Value Ref Range   Glucose-Capillary 136 (H) 70 - 99 mg/dL   Comment 1 Notify RN   Protime-INR  Result Value Ref Range   Prothrombin Time 14.0 11.6 - 15.2 seconds   INR 1.07 0.00 - 1.49  CBC  Result Value Ref Range   WBC 12.2 (H) 4.0 - 10.5 K/uL   RBC 4.40 4.22 - 5.81 MIL/uL   Hemoglobin 12.9 (L) 13.0 - 17.0 g/dL   HCT 08.6 57.8 - 46.9 %   MCV 94.3 78.0 - 100.0 fL   MCH 29.3 26.0 - 34.0 pg   MCHC 31.1 30.0 - 36.0 g/dL   RDW 62.9 52.8 - 41.3 %   Platelets 212  150 - 400 K/uL  Basic metabolic panel  Result Value Ref Range   Sodium 138 135 - 145 mmol/L   Potassium 4.6 3.5 - 5.1 mmol/L   Chloride 100 96 - 112 mEq/L   CO2 27 19 - 32 mmol/L   Glucose, Bld 107 (H) 70 - 99 mg/dL   BUN 11 6 - 23 mg/dL   Creatinine, Ser 2.44 0.50 - 1.35 mg/dL   Calcium 8.2 (L) 8.4 - 10.5 mg/dL   GFR calc non Af Amer >90 >90 mL/min   GFR calc Af Amer >90 >90 mL/min   Anion gap 11 5 - 15  Protime-INR  Result Value Ref Range   Prothrombin Time 18.2 (H) 11.6 - 15.2 seconds   INR 1.50 (H) 0.00 - 1.49  CBC  Result Value Ref Range   WBC 13.2 (H) 4.0 - 10.5 K/uL   RBC 3.92 (L) 4.22 - 5.81 MIL/uL   Hemoglobin 11.5 (L) 13.0 - 17.0 g/dL   HCT 01.0 (L) 27.2 - 53.6 %   MCV 92.1 78.0 - 100.0 fL   MCH 29.3 26.0 -  34.0 pg   MCHC 31.9 30.0 - 36.0 g/dL   RDW 16.1 09.6 - 04.5 %   Platelets 180 150 - 400 K/uL  Protime-INR  Result Value Ref Range   Prothrombin Time 34.0 (H) 11.6 - 15.2 seconds   INR 3.32 (H) 0.00 - 1.49  CBC  Result Value Ref Range   WBC 10.8 (H) 4.0 - 10.5 K/uL   RBC 3.62 (L) 4.22 - 5.81 MIL/uL   Hemoglobin 10.5 (L) 13.0 - 17.0 g/dL   HCT 40.9 (L) 81.1 - 91.4 %   MCV 92.3 78.0 - 100.0 fL   MCH 29.0 26.0 - 34.0 pg   MCHC 31.4 30.0 - 36.0 g/dL   RDW 78.2 95.6 - 21.3 %   Platelets 154 150 - 400 K/uL  Protime-INR  Result Value Ref Range   Prothrombin Time 29.9 (H) 11.6 - 15.2 seconds   INR 2.82 (H) 0.00 - 1.49    Discharge Medications:     Medication List    TAKE these medications        albuterol 108 (90 BASE) MCG/ACT inhaler  Commonly known as:  PROVENTIL HFA;VENTOLIN HFA  Inhale 2 puffs into the lungs every 6 (six) hours as needed for wheezing or shortness of breath.     aspirin EC 81 MG tablet  Take 81 mg by mouth daily.     atorvastatin 10 MG tablet  Commonly known as:  LIPITOR  Take 10 mg by mouth daily.     divalproex 500 MG 24 hr tablet  Commonly known as:  DEPAKOTE ER  Take 2,000 mg by mouth at bedtime.     methocarbamol 500  MG tablet  Commonly known as:  ROBAXIN  Take 1 tablet (500 mg total) by mouth 3 (three) times daily as needed.     nitroGLYCERIN 0.4 MG SL tablet  Commonly known as:  NITROSTAT  Place 1 tablet (0.4 mg total) under the tongue every 5 (five) minutes as needed for chest pain.     oxyCODONE-acetaminophen 5-325 MG per tablet  Commonly known as:  ROXICET  Take 1-2 tablets by mouth every 4 (four) hours as needed for severe pain.     sertraline 50 MG tablet  Commonly known as:  ZOLOFT  Take 150 mg by mouth every morning.     temazepam 15 MG capsule  Commonly known as:  RESTORIL  Take 15 mg by mouth at bedtime as needed for sleep.     traMADol 50 MG tablet  Commonly known as:  ULTRAM  Take 50 mg by mouth every 6 (six) hours as needed for moderate pain or severe pain.     traZODone 50 MG tablet  Commonly known as:  DESYREL  Take 25-200 mg by mouth at bedtime as needed for sleep.     warfarin 5 MG tablet  Commonly known as:  COUMADIN  Take 1 tablet (5 mg total) by mouth daily. Start 01/25/13     ZESTORETIC 10-12.5 MG per tablet  Generic drug:  lisinopril-hydrochlorothiazide  Take 1 tablet by mouth daily.        Diagnostic Studies: Dg Knee Left Port  01/21/2014   CLINICAL DATA:  Status post left knee prosthesis placement  EXAM: PORTABLE LEFT KNEE - 1-2 VIEW  COMPARISON:  None.  FINDINGS: A left knee prosthesis is noted. No acute abnormality is seen. Air is noted in the surgical bed.  IMPRESSION: Status post left knee replacement without acute abnormality.   Electronically Signed   By:  Alcide CleverMark  Lukens M.D.   On: 01/21/2014 13:44    Disposition: 01-Home or Self Care      Discharge Instructions    Call MD / Call 911    Complete by:  As directed   If you experience chest pain or shortness of breath, CALL 911 and be transported to the hospital emergency room.  If you develope a fever above 101 F, pus (white drainage) or increased drainage or redness at the wound, or calf pain, call your  surgeon's office.     Constipation Prevention    Complete by:  As directed   Drink plenty of fluids.  Prune juice may be helpful.  You may use a stool softener, such as Colace (over the counter) 100 mg twice a day.  Use MiraLax (over the counter) for constipation as needed.     Diet - low sodium heart healthy    Complete by:  As directed      Increase activity slowly as tolerated    Complete by:  As directed            Follow-up Information    Follow up with Kei Mcelhiney,STEVEN R, MD. Call in 2 weeks.   Specialty:  Orthopedic Surgery   Why:  [phone]   Contact information:   [address] [place] [address] [phone]       [facility]   Why:  Home Health Physical Therapy   Contact information:   [address] [place] [address] [phone]        Signed: Thea GistDIXON,THOMAS B 01/25/2014, 2:49 PM  No change in patient exam or disposition. Patient agreeable to SNF Rehab.  Needs aggressive Passive stretching, especially extension and active ROM and dangling for 90 degrees of flexion.  INR target 2.5-3.0  Verlee RossettiSteven R Forney Kleinpeter   01/26/14 7:45 am

## 2014-01-25 NOTE — Progress Notes (Signed)
   Subjective: 4 Days Post-Op Procedure(s) (LRB): LEFT TOTAL KNEE ARTHROPLASTY (Left)  Pt still c/o modeate pain to left knee today but not actively bleeding today INR 2.8 which is improved Bed available at SNF Patient reports pain as moderate.  Objective:   VITALS:   Filed Vitals:   01/25/14 1200  BP:   Pulse:   Temp:   Resp: 16    Left knee incision healing No active bleeding Moderate ecchymosis laterally Pain with rom nv intact distally  LABS  Recent Labs  01/23/14 0425 01/24/14 0425  HGB 11.5* 10.5*  HCT 36.1* 33.4*  WBC 13.2* 10.8*  PLT 180 154    No results for input(s): NA, K, BUN, CREATININE, GLUCOSE in the last 72 hours.   Assessment/Plan: 4 Days Post-Op Procedure(s) (LRB): LEFT TOTAL KNEE ARTHROPLASTY (Left) Knee and INR stable today Plan for d/c to SNF, today if able  Continue PT/OT and encourage mobilization     Alphonsa OverallBrad Elene Downum, MPAS, PA-C  01/25/2014, 2:46 PM

## 2014-01-26 LAB — PROTIME-INR
INR: 2.19 — ABNORMAL HIGH (ref 0.00–1.49)
Prothrombin Time: 24.5 seconds — ABNORMAL HIGH (ref 11.6–15.2)

## 2014-01-26 MED ORDER — WARFARIN SODIUM 5 MG PO TABS
5.0000 mg | ORAL_TABLET | Freq: Every day | ORAL | Status: DC
  Filled 2014-01-26: qty 1

## 2014-01-26 NOTE — Progress Notes (Signed)
ANTICOAGULATION CONSULT NOTE  Pharmacy Consult for Coumadin Indication: VTE prophylaxis  No Known Allergies  Patient Measurements: Height: 5\' 8"  (172.7 cm) Weight: 299 lb (135.626 kg) IBW/kg (Calculated) : 68.4  Vital Signs: Temp: 98.1 F (36.7 C) (01/13 0454) Temp Source: Oral (01/13 0454) BP: 114/51 mmHg (01/13 0454) Pulse Rate: 65 (01/13 0454)  Labs:  Recent Labs  01/24/14 0425 01/25/14 0610 01/26/14 0601  HGB 10.5*  --   --   HCT 33.4*  --   --   PLT 154  --   --   LABPROT 34.0* 29.9* 24.5*  INR 3.32* 2.82* 2.19*    Estimated Creatinine Clearance: 122.9 mL/min (by C-G formula based on Cr of 0.84).  Assessment: 64 year old male s/p TKA who continues on  Coumadin for VTE prophylaxis. INR trended up quickly.  INR therapeutic at 2.19. No bleeding noted.  Goal of Therapy:  INR 2-3   Plan:  -orders written for DC to SNF on coumadin 5 mg po daily with INR f/u at SNF -education completed 01/22/14  Herby AbrahamMichelle T. Vannesa Abair, Pharm.D. [phone] 01/26/2014 11:19 AM

## 2014-01-26 NOTE — Clinical Documentation Improvement (Signed)
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  Please clarify the type of OA and document findings in next progress note and discharge summary.   Primary  Secondary  Traumatic  Erosive  Other  Thank You, Shellee MiloEileen T Mckenzye Cutright ,RN Clinical Documentation Specialist:  [phone]  [facility]- Health Information Management

## 2014-01-26 NOTE — Progress Notes (Signed)
Physical Therapy Treatment Patient Details Name: [name] MRN: [id number] DOB: 05-Apr-1950 Today's Date: 01/26/2014    History of Present Illness pt is a 64 y.o. male s/p Lt TKA. Hx HTN, MI s/p pacemaker, cervical spine degenerative disease, per patient, arthritis in both hips and knees, as well as back pain chronically.    PT Comments    Pt limited to very short ambulation distance due to pain. Encouraged OOB activity to increase tolerance, pt verbalized understanding. Pt to benefit from SNF to have 24/7 (A) and post acute rehab.   Follow Up Recommendations  SNF;Supervision/Assistance - 24 hour     Equipment Recommendations  Rolling walker with 5" wheels;3in1 (PT)    Recommendations for Other Services       Precautions / Restrictions Precautions Precautions: Fall;Knee Precaution Comments: pt tends to keep Lt knee bent; education on importance of extension during rest  Required Braces or Orthoses: Knee Immobilizer - Left Knee Immobilizer - Left: On when out of bed or walking Restrictions Weight Bearing Restrictions: Yes LLE Weight Bearing: Weight bearing as tolerated    Mobility  Bed Mobility Overal bed mobility: Needs Assistance Bed Mobility: Supine to Sit     Supine to sit: Min guard;HOB elevated     General bed mobility comments: min guard to control Lt LE off EOB; using handrails and trapeze bar to perform bed mobility  Transfers Overall transfer level: Needs assistance   Transfers: Sit to/from Stand Sit to Stand: Min assist         General transfer comment: (A) to balance when transitioning UEs to RW; cues for safety  Ambulation/Gait Ambulation/Gait assistance: Min assist Ambulation Distance (Feet): 3 Feet Assistive device: Rolling walker (2 wheeled) Gait Pattern/deviations: Step-to pattern;Decreased step length - right;Decreased stance time - left;Antalgic;Wide base of support;Trunk flexed Gait velocity: decreased Gait velocity interpretation:  Below normal speed for age/gender General Gait Details: cues for gt sequencing and safety with RW; pt limited due to pain    Stairs            Wheelchair Mobility    Modified Rankin (Stroke Patients Only)       Balance Overall balance assessment: Needs assistance Sitting-balance support: Feet supported;No upper extremity supported Sitting balance-Leahy Scale: Fair Sitting balance - Comments: guarded due to pain   Standing balance support: During functional activity;Bilateral upper extremity supported Standing balance-Leahy Scale: Poor Standing balance comment: RW to balance                     Cognition Arousal/Alertness: Awake/alert Behavior During Therapy: WFL for tasks assessed/performed Overall Cognitive Status: Within Functional Limits for tasks assessed                      Exercises Total Joint Exercises Ankle Circles/Pumps: AROM;Both;10 reps;Supine Quad Sets: AROM;Left;10 reps;Seated Knee Flexion: AAROM;Left;10 reps;Seated;Limitations Knee Flexion Limitations: pain Goniometric ROM: 5 to 60 degrees limited by pain     General Comments        Pertinent Vitals/Pain Pain Assessment: 0-10 Pain Score: 8  Pain Location: Lt knee and "bottom"  Pain Descriptors / Indicators: Constant;Discomfort;Guarding;Sore Pain Intervention(s): Monitored during session;Premedicated before session;Repositioned    Home Living                      Prior Function            PT Goals (current goals can now be found in the care plan section) Acute Rehab PT  Goals Patient Stated Goal: to get out of here today PT Goal Formulation: With patient Time For Goal Achievement: 01/28/14 Potential to Achieve Goals: Good Progress towards PT goals: Progressing toward goals    Frequency  7X/week    PT Plan Current plan remains appropriate    Co-evaluation             End of Session Equipment Utilized During Treatment: Gait belt;Left knee  immobilizer Activity Tolerance: Patient limited by pain Patient left: in chair;with call bell/phone within reach     Time: 0911-0927 PT Time Calculation (min) (ACUTE ONLY): 16 min  Charges:  $Gait Training: 8-22 mins                    G CodesDonnamarie Poag [place], [place]  [phone] 01/26/2014, 9:46 AM

## 2014-01-26 NOTE — Progress Notes (Signed)
Chaplain attempted to complete spiritual care consult. Pt preparing to be discharged at this time. [name], Mayer MaskerCourtney F, Chaplain 01/26/2014 3:25 PM

## 2014-01-26 NOTE — Progress Notes (Signed)
Report called to Paschal, [facility]ity fund[facility]N [facility] in [place]. Questions and concerns denied at this time. Patient transported via medical transport to facility.

## 2014-01-26 NOTE — Progress Notes (Signed)
Orthopedics Progress Note  Subjective: It really hurts  Objective:  Filed Vitals:   01/26/14 0454  BP: 114/51  Pulse: 65  Temp: 98.1 F (36.7 C)  Resp: 18    General: Awake and alert  Musculoskeletal: incision looks a lot better, no bleeding, no erythema   Unable to get knee straight Neurovascularly intact  Lab Results  Component Value Date   WBC 10.8* 01/24/2014   HGB 10.5* 01/24/2014   HCT 33.4* 01/24/2014   MCV 92.3 01/24/2014   PLT 154 01/24/2014       Component Value Date/Time   NA 138 01/22/2014 0440   K 4.6 01/22/2014 0440   CL 100 01/22/2014 0440   CO2 27 01/22/2014 0440   GLUCOSE 107* 01/22/2014 0440   BUN 11 01/22/2014 0440   CREATININE 0.84 01/22/2014 0440   CALCIUM 8.2* 01/22/2014 0440   GFRNONAA >90 01/22/2014 0440   GFRAA >90 01/22/2014 0440    Lab Results  Component Value Date   INR 2.19* 01/26/2014   INR 2.82* 01/25/2014   INR 3.32* 01/24/2014    Assessment/Plan: POD #5 s/p Procedure(s): LEFT TOTAL KNEE ARTHROPLASTY Patient agreeable for SNF Rehab, FL-2 signed, bed available D/c to rehab  Viviann SpareSteven R. Ranell PatrickNorris, MD 01/26/2014 7:50 AM

## 2014-01-26 NOTE — Clinical Social Work Note (Signed)
Patient to discharge today per MD order. Patient to discharge to: [facility] [place] RN to call report prior to transportation to: [phone] Transportation:[facility] scheduled for 2pm (per SNF request)  RN and patient made aware and both are agreeable.  Vickii PennaGina Kalijah Zeiss, LCSWA [phone]  Psychiatric & Orthopedics (5N 1-16) Clinical Social Worker

## 2014-04-06 ENCOUNTER — Other Ambulatory Visit (HOSPITAL_COMMUNITY): Payer: Self-pay | Admitting: Orthopedic Surgery

## 2014-04-06 DIAGNOSIS — Z471 Aftercare following joint replacement surgery: Secondary | ICD-10-CM

## 2014-04-07 ENCOUNTER — Encounter (HOSPITAL_COMMUNITY): Payer: Medicaid Other

## 2014-04-18 ENCOUNTER — Telehealth: Payer: Self-pay | Admitting: *Deleted

## 2014-04-18 NOTE — Telephone Encounter (Signed)
Called patient to notify of alert received on Merlin. Patient denies any symptoms. He is currently in a nursing facility and reports he will try to arrange transportation to the office for device clinic appt and call back to schedule.

## 2014-04-20 ENCOUNTER — Ambulatory Visit (INDEPENDENT_AMBULATORY_CARE_PROVIDER_SITE_OTHER): Payer: Medicaid Other | Admitting: *Deleted

## 2014-04-20 ENCOUNTER — Encounter: Payer: Self-pay | Admitting: Internal Medicine

## 2014-04-20 DIAGNOSIS — I441 Atrioventricular block, second degree: Secondary | ICD-10-CM

## 2014-04-20 LAB — MDC_IDC_ENUM_SESS_TYPE_INCLINIC
Implantable Pulse Generator Model: 2240
MDC IDC PG SERIAL: 7669661

## 2014-04-20 NOTE — Progress Notes (Signed)
Pt in clinic for follow up with decrease in atrial lead impedance and 4779 atrial noise reversions. ACap Confirm turned off and atrial sensitivity changed from 0.5 to 1.4675mV. GT checked and made changes to device. Follow up as planned.

## 2014-04-21 ENCOUNTER — Encounter: Payer: Medicaid Other | Admitting: *Deleted

## 2014-04-21 ENCOUNTER — Telehealth: Payer: Self-pay | Admitting: Cardiology

## 2014-04-21 NOTE — Telephone Encounter (Signed)
LMOVM reminding pt to send remote transmission.   

## 2014-04-22 ENCOUNTER — Encounter: Payer: Self-pay | Admitting: Cardiology

## 2014-04-25 ENCOUNTER — Ambulatory Visit: Payer: Medicaid Other | Admitting: *Deleted

## 2014-04-25 DIAGNOSIS — I441 Atrioventricular block, second degree: Secondary | ICD-10-CM

## 2014-04-26 NOTE — Progress Notes (Signed)
Remote pacemaker transmission.   

## 2014-05-05 ENCOUNTER — Telehealth: Payer: Self-pay | Admitting: *Deleted

## 2014-05-05 NOTE — Telephone Encounter (Signed)
LMTCB//sss 

## 2014-05-06 NOTE — Telephone Encounter (Signed)
Spoke to GT about low atrial lead impedance. Plan to watch for now---no changes.sss

## 2014-05-12 ENCOUNTER — Encounter: Payer: Self-pay | Admitting: Internal Medicine

## 2014-06-28 DIAGNOSIS — I2510 Atherosclerotic heart disease of native coronary artery without angina pectoris: Secondary | ICD-10-CM | POA: Insufficient documentation

## 2014-06-28 DIAGNOSIS — Z720 Tobacco use: Secondary | ICD-10-CM | POA: Insufficient documentation

## 2014-06-28 DIAGNOSIS — Z6841 Body Mass Index (BMI) 40.0 and over, adult: Secondary | ICD-10-CM

## 2014-06-30 DIAGNOSIS — I959 Hypotension, unspecified: Secondary | ICD-10-CM | POA: Insufficient documentation

## 2014-07-19 ENCOUNTER — Encounter: Payer: Self-pay | Admitting: Internal Medicine

## 2014-07-26 ENCOUNTER — Ambulatory Visit (INDEPENDENT_AMBULATORY_CARE_PROVIDER_SITE_OTHER): Payer: Medicaid Other | Admitting: *Deleted

## 2014-07-26 ENCOUNTER — Encounter: Payer: Self-pay | Admitting: Internal Medicine

## 2014-07-26 DIAGNOSIS — I441 Atrioventricular block, second degree: Secondary | ICD-10-CM

## 2014-07-26 NOTE — Progress Notes (Signed)
Remote pacemaker transmission.   

## 2014-08-02 LAB — CUP PACEART REMOTE DEVICE CHECK
Battery Remaining Percentage: 95.5 %
Brady Statistic AP VP Percent: 2.8 %
Brady Statistic AP VS Percent: 3.7 %
Brady Statistic AS VP Percent: 2.5 %
Brady Statistic AS VS Percent: 73 %
Brady Statistic RA Percent Paced: 3.7 %
Brady Statistic RV Percent Paced: 5.5 %
Date Time Interrogation Session: 20160712060016
Lead Channel Impedance Value: 380 Ohm
Lead Channel Impedance Value: 450 Ohm
Lead Channel Pacing Threshold Amplitude: 1.25 V
Lead Channel Sensing Intrinsic Amplitude: 4.8 mV
Lead Channel Setting Pacing Amplitude: 1.5 V
Lead Channel Setting Pacing Amplitude: 5 V
Lead Channel Setting Pacing Pulse Width: 0.5 ms
Lead Channel Setting Sensing Sensitivity: 2 mV
MDC IDC MSMT BATTERY REMAINING LONGEVITY: 95 mo
MDC IDC MSMT BATTERY VOLTAGE: 3.02 V
MDC IDC MSMT LEADCHNL RV PACING THRESHOLD PULSEWIDTH: 0.5 ms
MDC IDC MSMT LEADCHNL RV SENSING INTR AMPL: 10.5 mV
Pulse Gen Serial Number: 7669661

## 2014-08-19 ENCOUNTER — Encounter: Payer: Self-pay | Admitting: Cardiology

## 2014-10-18 ENCOUNTER — Encounter: Payer: Self-pay | Admitting: *Deleted

## 2014-12-07 ENCOUNTER — Encounter: Payer: Self-pay | Admitting: Internal Medicine

## 2014-12-07 ENCOUNTER — Ambulatory Visit (INDEPENDENT_AMBULATORY_CARE_PROVIDER_SITE_OTHER): Payer: Medicaid Other | Admitting: Internal Medicine

## 2014-12-07 ENCOUNTER — Other Ambulatory Visit: Payer: Self-pay

## 2014-12-07 VITALS — BP 150/80 | HR 81 | Ht 68.0 in | Wt 294.8 lb

## 2014-12-07 DIAGNOSIS — I10 Essential (primary) hypertension: Secondary | ICD-10-CM

## 2014-12-07 DIAGNOSIS — Z950 Presence of cardiac pacemaker: Secondary | ICD-10-CM

## 2014-12-07 DIAGNOSIS — I441 Atrioventricular block, second degree: Secondary | ICD-10-CM | POA: Diagnosis not present

## 2014-12-07 MED ORDER — SILDENAFIL CITRATE 50 MG PO TABS: 50.0000 mg | ORAL_TABLET | Freq: Every day | ORAL | Status: DC | PRN

## 2014-12-07 NOTE — Assessment & Plan Note (Signed)
His systolic blood pressure is elevated. He will continue his current meds. He is encouraged to lose weight and to eat less salt.

## 2014-12-07 NOTE — Assessment & Plan Note (Signed)
He is encouraged to lose weight. He states that he will try.

## 2014-12-07 NOTE — Patient Instructions (Signed)
Medication Instructions:  Your physician recommends that you continue on your current medications as directed. Please refer to the Current Medication list given to you today.   Labwork: None ordered   Testing/Procedures: None ordered   Follow-Up: Your physician wants you to follow-up in: 12 months with Dr Court Joyaylor You will receive a reminder letter in the mail two months in advance. If you don't receive a letter, please call our office to schedule the follow-up appointment.  Remote monitoring is used to monitor your Pacemaker  from home. This monitoring reduces the number of office visits required to check your device to one time per year. It allows us to keep an eye on the functioning of your device to ensure it is working properly. You are scheduled for a device check from home on 03/13/15. You may send your transmission at any time that day. If you have a wireless device, the transmission will be sent automatically. After your physician reviews your transmission, you will receive a postcard with your next transmission date.      Any Other Special Instructions Will Be Listed Below (If Applicable).     If you need a refill on your cardiac medications before your next appointment, please call your pharmacy.

## 2014-12-07 NOTE — Progress Notes (Signed)
HPI [name] returns today for followup. He is a very pleasant 64 year old man with a history of hypertension, symptomatic high-grade heart block, status post permanent pacemaker insertion. The patient moved to [place] several years ago, and ultimately was incarcerated. He is moved back to [place] and has resided here for over 2 years. He has a history of chest pain but none recently. No peripheral edema. His weight has gone up but he denies dietary indiscretion.   No Known Allergies   Current Outpatient Prescriptions  Medication Sig Dispense Refill  . ARIPiprazole (ABILIFY) 5 MG tablet Take 5 mg by mouth daily.    Marland Kitchen. aspirin 81 MG tablet Take 81 mg by mouth daily.    . busPIRone (BUSPAR) 7.5 MG tablet Take 7.5 mg by mouth 3 (three) times daily.    . Calcium Carb-Cholecalciferol (CALCIUM-VITAMIN D) 600-400 MG-UNIT TABS Take 1 tablet by mouth 2 (two) times daily.    . divalproex (DEPAKOTE ER) 500 MG 24 hr tablet Take 2,000 mg by mouth at bedtime.  1  . famotidine (PEPCID) 20 MG tablet Take 20 mg by mouth at bedtime.    Marland Kitchen. HYDROcodone-acetaminophen (NORCO/VICODIN) 5-325 MG tablet Take 1 tablet by mouth 3 (three) times daily.    Marland Kitchen. lisinopril-hydrochlorothiazide (ZESTORETIC) 10-12.5 MG per tablet Take 1 tablet by mouth daily.    . pravastatin (PRAVACHOL) 80 MG tablet Take 80 mg by mouth at bedtime and may repeat dose one time if needed.    Marland Kitchen. rOPINIRole (REQUIP) 0.5 MG tablet Take 0.5 mg by mouth at bedtime.    . sertraline (ZOLOFT) 100 MG tablet Take 200 mg by mouth daily.    . traZODone (DESYREL) 100 MG tablet Take 100 mg by mouth at bedtime.    . sildenafil (VIAGRA) 50 MG tablet Take 1 tablet (50 mg total) by mouth daily as needed for erectile dysfunction. 5 tablet 1   No current facility-administered medications for this visit.     Past Medical History  Diagnosis Date  . Cervical radiculopathy   . Asthma   . Cardiac pacemaker in situ 06/11/02    Dual chamber PM for symptomatic  intermittent high-grade heart block with syncope  . Knee pain   . COPD (chronic obstructive pulmonary disease) (HCC)   . CHF (congestive heart failure) (HCC)   . DJD (degenerative joint disease), cervical   . Bone spur   . Chest pain 07/01/13    Went to ED  . Neck pain 06/2013    Left sided with pain radiating down left arm. Pain associated with numbness in left arm. Worse with movement.   . Obesity   . CAD (coronary artery disease)   . Syncope   . Hypertension   . Hyperlipidemia   . Myocardial infarction (HCC)   . Presence of permanent cardiac pacemaker     Dr Sharrell KuGreg Taylor  . OSA (obstructive sleep apnea)     stopped using CPAP 2008  . Shortness of breath dyspnea     with exertion  . Depression   . Anxiety   . Bipolar disorder (HCC)     ROS:   All systems reviewed and negative except as noted in the HPI.   Past Surgical History  Procedure Laterality Date  . Pacemaker insertion  06/11/02    Dual chamber PM for symptomatic intermittent high-grade heart block with syncope  . Shoulder arthroscopy Right 11/11/01  . Knee arthroscopy Left 06/17/05  . Pacemaker generator change N/A 09/17/2013  Procedure: PACEMAKER GENERATOR CHANGE;  Surgeon: Marinus Maw, MD;  Location: [facility] CATH LAB;  Service: Cardiovascular;  Laterality: N/A;  . Appendectomy  1975  . Insert / replace / remove pacemaker  09/17/13  . Total knee arthroplasty Left 01/21/2014    Procedure: LEFT TOTAL KNEE ARTHROPLASTY;  Surgeon: Verlee Rossetti, MD;  Location: [facility] OR;  Service: Orthopedics;  Laterality: Left;     Family History  Problem Relation Age of Onset  . Heart disease Mother   . Diabetes Mother      Social History   Social History  . Marital Status: Married    Spouse Name: N/A  . Number of Children: N/A  . Years of Education: N/A   Occupational History  . Not on file.   Social History Main Topics  . Smoking status: Current Every Day Smoker -- 1.00 packs/day for 42 years    Types: Cigarettes,  Cigars  . Smokeless tobacco: Never Used  . Alcohol Use: No  . Drug Use: No  . Sexual Activity: Not on file   Other Topics Concern  . Not on file   Social History Narrative     BP 150/80 mmHg  Pulse 81  Ht  (1.727 m)  Wt 294 lb 12.8 oz (133.72 kg)  BMI 44.83 kg/m2  Physical Exam:  Well appearing but diskempt 64 year old man, NAD HEENT: Unremarkable Neck:  7 cm JVD, no thyromegally Back:  No CVA tenderness Lungs:  Clear with no wheezes, rales, or rhonchi. HEART:  Regular rate rhythm, no murmurs, no rubs, no clicks Abd:  soft, positive bowel sounds, no organomegally, no rebound, no guarding Ext:  2 plus pulses, trace peripheral edema, no cyanosis, no clubbing Skin:  No rashes no nodules Neuro:  CN II through XII intact, motor grossly intact   DEVICE  The patient has a dual-chamber St. Jude device which is working [employer].  Assess/Plan:

## 2014-12-07 NOTE — Assessment & Plan Note (Signed)
His St. Jude DDD PM is working normally. Will recheck in several months. 

## 2014-12-10 LAB — CUP PACEART INCLINIC DEVICE CHECK
Battery Remaining Longevity: 135.6
Battery Voltage: 3.02 V
Implantable Lead Implant Date: 20040528
Lead Channel Impedance Value: 325 Ohm
Lead Channel Impedance Value: 337.5 Ohm
Lead Channel Pacing Threshold Amplitude: 0.75 V
Lead Channel Pacing Threshold Amplitude: 0.75 V
Lead Channel Pacing Threshold Amplitude: 0.75 V
Lead Channel Pacing Threshold Amplitude: 0.75 V
Lead Channel Pacing Threshold Pulse Width: 0.5 ms
Lead Channel Pacing Threshold Pulse Width: 0.5 ms
Lead Channel Sensing Intrinsic Amplitude: 12 mV
Lead Channel Setting Pacing Amplitude: 2.5 V
Lead Channel Setting Sensing Sensitivity: 2 mV
MDC IDC LEAD IMPLANT DT: 20040528
MDC IDC LEAD LOCATION: 753859
MDC IDC LEAD LOCATION: 753860
MDC IDC MSMT LEADCHNL RA PACING THRESHOLD PULSEWIDTH: 0.5 ms
MDC IDC MSMT LEADCHNL RA SENSING INTR AMPL: 5 mV
MDC IDC MSMT LEADCHNL RV PACING THRESHOLD PULSEWIDTH: 0.5 ms
MDC IDC SESS DTM: 20161123191644
MDC IDC SET LEADCHNL RV PACING AMPLITUDE: 2.5 V
MDC IDC SET LEADCHNL RV PACING PULSEWIDTH: 0.5 ms
MDC IDC STAT BRADY RA PERCENT PACED: 3.6 %
MDC IDC STAT BRADY RV PERCENT PACED: 5.6 %
Pulse Gen Serial Number: 7669661

## 2015-03-08 ENCOUNTER — Telehealth: Payer: Self-pay | Admitting: Cardiology

## 2015-03-08 ENCOUNTER — Ambulatory Visit (INDEPENDENT_AMBULATORY_CARE_PROVIDER_SITE_OTHER): Payer: Medicaid Other | Admitting: *Deleted

## 2015-03-08 DIAGNOSIS — I441 Atrioventricular block, second degree: Secondary | ICD-10-CM | POA: Diagnosis not present

## 2015-03-08 DIAGNOSIS — Z950 Presence of cardiac pacemaker: Secondary | ICD-10-CM

## 2015-03-08 NOTE — Telephone Encounter (Signed)
LMOVM reminding pt to send remote transmission.   

## 2015-03-09 ENCOUNTER — Telehealth: Payer: Self-pay | Admitting: Internal Medicine

## 2015-03-09 NOTE — Progress Notes (Signed)
Remote pacemaker transmission.   

## 2015-03-09 NOTE — Telephone Encounter (Signed)
Returned call- transmission received at 12:21. Pt appreciative of call.

## 2015-03-09 NOTE — Telephone Encounter (Signed)
New Message  Pt received message- did not know if remote check went through successfully or if he needs to send again. Please call back and discuss.

## 2015-04-04 LAB — CUP PACEART REMOTE DEVICE CHECK
Brady Statistic RV Percent Paced: 5.4 %
Implantable Lead Implant Date: 20040528
Implantable Lead Location: 753860
Lead Channel Impedance Value: 310 Ohm
MDC IDC LEAD IMPLANT DT: 20040528
MDC IDC LEAD LOCATION: 753859
MDC IDC MSMT LEADCHNL RA IMPEDANCE VALUE: 290 Ohm
MDC IDC MSMT LEADCHNL RA SENSING INTR AMPL: 2.6 mV
MDC IDC MSMT LEADCHNL RV SENSING INTR AMPL: 7.5 mV
MDC IDC SESS DTM: 20170321144409
MDC IDC STAT BRADY RA PERCENT PACED: 5.1 %
Pulse Gen Serial Number: 7669661

## 2015-04-05 ENCOUNTER — Encounter: Payer: Self-pay | Admitting: Cardiology

## 2015-04-21 ENCOUNTER — Encounter: Payer: Self-pay | Admitting: Cardiology

## 2015-05-29 ENCOUNTER — Telehealth: Payer: Self-pay | Admitting: Internal Medicine

## 2015-05-29 NOTE — Telephone Encounter (Signed)
Left VM for patient. DMV Cardiovascular paper signed and completed by Dr.Taylor.

## 2015-05-29 NOTE — Telephone Encounter (Signed)
Patient aware DMV paper ready. Faxed to [facility] [place]  [address] [place] [phone] Mailed copy back to patient home address per his request.

## 2015-06-07 ENCOUNTER — Telehealth: Payer: Self-pay | Admitting: Cardiology

## 2015-06-07 ENCOUNTER — Ambulatory Visit (INDEPENDENT_AMBULATORY_CARE_PROVIDER_SITE_OTHER): Payer: Medicaid Other | Admitting: *Deleted

## 2015-06-07 DIAGNOSIS — I441 Atrioventricular block, second degree: Secondary | ICD-10-CM | POA: Diagnosis not present

## 2015-06-07 DIAGNOSIS — Z950 Presence of cardiac pacemaker: Secondary | ICD-10-CM

## 2015-06-07 NOTE — Telephone Encounter (Signed)
Spoke with pt and reminded pt of remote transmission that is due today. Pt verbalized understanding.   

## 2015-06-08 NOTE — Progress Notes (Signed)
Remote pacemaker transmission.   

## 2015-06-23 DIAGNOSIS — G250 Essential tremor: Secondary | ICD-10-CM | POA: Insufficient documentation

## 2015-06-23 DIAGNOSIS — R29898 Other symptoms and signs involving the musculoskeletal system: Secondary | ICD-10-CM | POA: Insufficient documentation

## 2015-06-23 DIAGNOSIS — G2581 Restless legs syndrome: Secondary | ICD-10-CM | POA: Insufficient documentation

## 2015-06-26 LAB — CUP PACEART REMOTE DEVICE CHECK
Implantable Lead Implant Date: 20040528
Implantable Lead Implant Date: 20040528
Implantable Lead Location: 753859
Implantable Lead Location: 753860
Lead Channel Sensing Intrinsic Amplitude: 7.6 mV
MDC IDC MSMT LEADCHNL RA SENSING INTR AMPL: 2.7 mV
MDC IDC SESS DTM: 20170612145550
Pulse Gen Serial Number: 7669661

## 2015-07-04 ENCOUNTER — Encounter: Payer: Self-pay | Admitting: Cardiology

## 2015-07-07 DIAGNOSIS — M5417 Radiculopathy, lumbosacral region: Secondary | ICD-10-CM | POA: Insufficient documentation

## 2015-08-04 DIAGNOSIS — M545 Low back pain, unspecified: Secondary | ICD-10-CM | POA: Insufficient documentation

## 2015-08-14 DIAGNOSIS — M5126 Other intervertebral disc displacement, lumbar region: Secondary | ICD-10-CM | POA: Insufficient documentation

## 2015-09-06 ENCOUNTER — Ambulatory Visit (INDEPENDENT_AMBULATORY_CARE_PROVIDER_SITE_OTHER): Payer: Medicaid Other | Admitting: *Deleted

## 2015-09-06 DIAGNOSIS — I441 Atrioventricular block, second degree: Secondary | ICD-10-CM

## 2015-09-06 NOTE — Progress Notes (Signed)
Remote pacemaker transmission.   

## 2015-09-13 ENCOUNTER — Encounter: Payer: Self-pay | Admitting: Cardiology

## 2015-09-20 LAB — CUP PACEART REMOTE DEVICE CHECK
Battery Remaining Percentage: 95 %
Battery Voltage: 3.01 V
Date Time Interrogation Session: 20170906122509
Implantable Lead Implant Date: 20040528
Implantable Lead Location: 753860
Lead Channel Impedance Value: 340 Ohm
Lead Channel Sensing Intrinsic Amplitude: 4.3 mV
Lead Channel Sensing Intrinsic Amplitude: 8.1 mV
Lead Channel Setting Pacing Amplitude: 2.5 V
Lead Channel Setting Sensing Sensitivity: 2 mV
MDC IDC LEAD IMPLANT DT: 20040528
MDC IDC LEAD LOCATION: 753859
MDC IDC MSMT BATTERY REMAINING LONGEVITY: 99 mo
MDC IDC MSMT LEADCHNL RA IMPEDANCE VALUE: 290 Ohm
MDC IDC SET LEADCHNL RA PACING AMPLITUDE: 2.5 V
MDC IDC SET LEADCHNL RV PACING PULSEWIDTH: 0.5 ms
MDC IDC STAT BRADY RA PERCENT PACED: 7.3 %
MDC IDC STAT BRADY RV PERCENT PACED: 7.1 %
Pulse Gen Model: 2240
Pulse Gen Serial Number: 7669661

## 2015-11-13 ENCOUNTER — Telehealth: Payer: Self-pay | Admitting: Internal Medicine

## 2015-11-13 NOTE — Telephone Encounter (Signed)
Informed pt that he is not due to send a remote transmission. He will have an in office visit w/ MD on 12-12-2015. Pt verbalized understanding.

## 2015-11-13 NOTE — Telephone Encounter (Signed)
New Message:    Please call,concerning his remote appt.

## 2015-12-01 ENCOUNTER — Encounter: Payer: Self-pay | Admitting: Internal Medicine

## 2015-12-12 ENCOUNTER — Encounter: Payer: Self-pay | Admitting: Internal Medicine

## 2015-12-12 ENCOUNTER — Ambulatory Visit (INDEPENDENT_AMBULATORY_CARE_PROVIDER_SITE_OTHER): Payer: Medicaid Other | Admitting: Internal Medicine

## 2015-12-12 VITALS — BP 132/70 | HR 76 | Ht 68.0 in | Wt 265.2 lb

## 2015-12-12 DIAGNOSIS — I441 Atrioventricular block, second degree: Secondary | ICD-10-CM | POA: Diagnosis not present

## 2015-12-12 DIAGNOSIS — Z950 Presence of cardiac pacemaker: Secondary | ICD-10-CM | POA: Diagnosis not present

## 2015-12-12 LAB — CUP PACEART INCLINIC DEVICE CHECK
Battery Voltage: 3.01 V
Brady Statistic RA Percent Paced: 6.9 %
Brady Statistic RV Percent Paced: 6.3 %
Date Time Interrogation Session: 20171128150110
Implantable Lead Implant Date: 20040528
Implantable Lead Location: 753859
Implantable Pulse Generator Implant Date: 20150904
Lead Channel Pacing Threshold Amplitude: 0.5 V
Lead Channel Pacing Threshold Amplitude: 0.75 V
Lead Channel Pacing Threshold Amplitude: 0.75 V
Lead Channel Pacing Threshold Pulse Width: 0.5 ms
Lead Channel Pacing Threshold Pulse Width: 0.5 ms
Lead Channel Setting Pacing Amplitude: 2.5 V
MDC IDC LEAD IMPLANT DT: 20040528
MDC IDC LEAD LOCATION: 753860
MDC IDC MSMT LEADCHNL RA IMPEDANCE VALUE: 275 Ohm
MDC IDC MSMT LEADCHNL RA PACING THRESHOLD PULSEWIDTH: 0.5 ms
MDC IDC MSMT LEADCHNL RA SENSING INTR AMPL: 3.9 mV
MDC IDC MSMT LEADCHNL RV IMPEDANCE VALUE: 312.5 Ohm
MDC IDC MSMT LEADCHNL RV PACING THRESHOLD AMPLITUDE: 0.5 V
MDC IDC MSMT LEADCHNL RV PACING THRESHOLD PULSEWIDTH: 0.5 ms
MDC IDC MSMT LEADCHNL RV SENSING INTR AMPL: 8.6 mV
MDC IDC SET LEADCHNL RA PACING AMPLITUDE: 2.5 V
MDC IDC SET LEADCHNL RV PACING PULSEWIDTH: 0.5 ms
MDC IDC SET LEADCHNL RV SENSING SENSITIVITY: 2 mV
Pulse Gen Model: 2240
Pulse Gen Serial Number: 7669661

## 2015-12-12 MED ORDER — VARENICLINE TARTRATE 1 MG PO TABS: 1.0000 mg | ORAL_TABLET | Freq: Two times a day (BID) | ORAL | 2 refills | Status: DC

## 2015-12-12 NOTE — Patient Instructions (Addendum)
Medication Instructions:  Your physician has recommended you make the following change in your medication:  1) START Chantix 1mg  (1 tablet) twice daily   Labwork: None Ordered   Testing/Procedures: None Ordered   Follow-Up: Your physician wants you to follow-up in: 1 year with Dr. Ladona Ridgelaylor. You will receive a reminder letter in the mail two months in advance. If you don't receive a letter, please call our office to schedule the follow-up appointment.  Remote monitoring is used to monitor your Pacemaker from home. This monitoring reduces the number of office visits required to check your device to one time per year. It allows us to keep an eye on the functioning of your device to ensure it is working properly. You are scheduled for a device check from home on 03/12/16. You may send your transmission at any time that day. If you have a wireless device, the transmission will be sent automatically. After your physician reviews your transmission, you will receive a postcard with your next transmission date.    Any Other Special Instructions Will Be Listed Below (If Applicable).     If you need a refill on your cardiac medications before your next appointment, please call your pharmacy.

## 2015-12-12 NOTE — Progress Notes (Signed)
HPI [name] returns today for followup. He is a very pleasant 65 year old man with a history of hypertension, symptomatic high-grade heart block, status post permanent pacemaker insertion and morbid obesity. The patient moved to [place] several years ago, and ultimately was incarcerated. He is moved back to [place] and has resided here for over 3 years. He has a history of chest pain but none recently. No peripheral edema. His weight has gone up but he denies dietary indiscretion.   Allergies  Allergen Reactions  . Nicotine     The patch caused blisters     Current Outpatient Prescriptions  Medication Sig Dispense Refill  . ARIPiprazole (ABILIFY) 10 MG tablet Take 10 mg by mouth daily.    Marland Kitchen. aspirin 81 MG tablet Take 81 mg by mouth daily.    . busPIRone (BUSPAR) 7.5 MG tablet Take 7.5 mg by mouth 3 (three) times daily.    . divalproex (DEPAKOTE ER) 500 MG 24 hr tablet Take 2,000 mg by mouth at bedtime.  1  . famotidine (PEPCID) 20 MG tablet Take 20 mg by mouth at bedtime.    . gabapentin (NEURONTIN) 100 MG capsule Take 1 capsule by mouth at bedtime.    Marland Kitchen. HYDROcodone-acetaminophen (NORCO/VICODIN) 5-325 MG tablet Take 1 tablet by mouth 3 (three) times daily.    Marland Kitchen. lisinopril-hydrochlorothiazide (PRINZIDE,ZESTORETIC) 20-25 MG tablet Take 1 tablet by mouth daily.    . naproxen (NAPROSYN) 250 MG tablet Take 750 mg by mouth every morning.    . pravastatin (PRAVACHOL) 80 MG tablet Take 80 mg by mouth at bedtime.     . primidone (MYSOLINE) 250 MG tablet Take 250 mg by mouth at bedtime.    Marland Kitchen. rOPINIRole (REQUIP) 0.5 MG tablet Take 0.5 mg by mouth at bedtime.    . sertraline (ZOLOFT) 100 MG tablet Take 200 mg by mouth daily.    . traZODone (DESYREL) 50 MG tablet Take 125 mg by mouth at bedtime.     No current facility-administered medications for this visit.      Past Medical History:  Diagnosis Date  . Anxiety   . Asthma   . Bipolar disorder (HCC)   . Bone spur   . CAD (coronary  artery disease)   . Cardiac pacemaker in situ 06/11/02   Dual chamber PM for symptomatic intermittent high-grade heart block with syncope  . Cervical radiculopathy   . Chest pain 07/01/13   Went to ED  . CHF (congestive heart failure) (HCC)   . COPD (chronic obstructive pulmonary disease) (HCC)   . Depression   . DJD (degenerative joint disease), cervical   . Hyperlipidemia   . Hypertension   . Knee pain   . Myocardial infarction   . Neck pain 06/2013   Left sided with pain radiating down left arm. Pain associated with numbness in left arm. Worse with movement.   . Obesity   . OSA (obstructive sleep apnea)    stopped using CPAP 2008  . Presence of permanent cardiac pacemaker    Dr Sharrell KuGreg Brysyn Brandenberger  . Shortness of breath dyspnea    with exertion  . Syncope     ROS:   All systems reviewed and negative except as noted in the HPI.   Past Surgical History:  Procedure Laterality Date  . APPENDECTOMY  1975  . INSERT / REPLACE / REMOVE PACEMAKER  09/17/13  . KNEE ARTHROSCOPY Left 06/17/05  . PACEMAKER GENERATOR CHANGE N/A 09/17/2013   Procedure: PACEMAKER GENERATOR CHANGE;  Surgeon: Marinus MawGregg W Vadis Slabach, MD;  [facility] C[facility] LAB;  Service: Cardiovascular;  Laterality: N/A;  . PACEMAKER INSERTION  06/11/02   Dual chamber PM for symptomatic intermittent high-grade heart block with syncope  . SHOULDER ARTHROSCOPY Right 11/11/01  . TOTAL KNEE ARTHROPLASTY Left 01/21/2014   Procedure: LEFT TOTAL KNEE ARTHROPLASTY;  Surgeon: Verlee RossettiSteven R Norris, MD;  Location: [facility];  Service: Orthopedics;  Laterality: Left;     Family History  Problem Relation Age of Onset  . Heart disease Mother   . Diabetes Mother      Social History   Social History  . Marital status: Married    Spouse name: N/A  . Number of children: N/A  . Years of education: N/A   Occupational History  . Not on file.   Social History Main Topics  . Smoking status: Current Some Day Smoker    Packs/day: 1.00    Years: 42.00     Types: Cigarettes, Cigars  . Smokeless tobacco: Never Used  . Alcohol use No  . Drug use: No  . Sexual activity: Not on file   Other Topics Concern  . Not on file   Social History Narrative  . No narrative on file     BP 132/70   Pulse 76   Ht 5\' 8"  (1.727 m)   Wt 265 lb 3.2 oz (120.3 kg)   BMI 40.32 kg/m   Physical Exam:  Well appearing but diskempt 65 year old man, NAD HEENT: Unremarkable Neck:  Unable to assess JVD, no thyromegally Back:  No CVA tenderness Lungs:  Clear with no wheezes, rales, or rhonchi. HEART:  Regular rate rhythm, no murmurs, no rubs, no clicks Abd:  soft, positive bowel sounds, no organomegally, no rebound, no guarding Ext:  2 plus pulses, trace peripheral edema, no cyanosis, no clubbing Skin:  No rashes no nodules Neuro:  CN II through XII intact, motor grossly intact   DEVICE  The patient has a dual-chamber St. Jude device which is working [employer].  Assess/Plan: 1. HTN - his blood pressure is ok. Will follow. 2. Obesity - he is encouraged to lose weight. 3. PPM - his St. Jude DDD PM is working normally. Will follow. 4. Tobacco abuse - he would like to stop smoking and has requested a prescription for Chantix.   Leonia ReevesGregg Crissie Aloi,M.D.

## 2016-01-26 ENCOUNTER — Telehealth: Payer: Self-pay

## 2016-02-22 NOTE — Telephone Encounter (Signed)
error 

## 2016-03-12 ENCOUNTER — Telehealth: Payer: Self-pay | Admitting: Cardiology

## 2016-03-12 ENCOUNTER — Ambulatory Visit (INDEPENDENT_AMBULATORY_CARE_PROVIDER_SITE_OTHER): Payer: Medicaid Other | Admitting: *Deleted

## 2016-03-12 DIAGNOSIS — I441 Atrioventricular block, second degree: Secondary | ICD-10-CM | POA: Diagnosis not present

## 2016-03-12 NOTE — Telephone Encounter (Signed)
Spoke with pt and reminded pt of remote transmission that is due today. Pt verbalized understanding.   

## 2016-03-13 ENCOUNTER — Encounter: Payer: Self-pay | Admitting: Cardiology

## 2016-03-13 NOTE — Progress Notes (Signed)
Remote pacemaker transmission.   

## 2016-03-15 LAB — CUP PACEART REMOTE DEVICE CHECK
Implantable Lead Implant Date: 20040528
Implantable Lead Location: 753859
MDC IDC LEAD IMPLANT DT: 20040528
MDC IDC LEAD LOCATION: 753860
MDC IDC PG IMPLANT DT: 20150904
MDC IDC PG SERIAL: 7669661
MDC IDC SESS DTM: 20180302161419
Pulse Gen Model: 2240

## 2016-06-12 ENCOUNTER — Ambulatory Visit (INDEPENDENT_AMBULATORY_CARE_PROVIDER_SITE_OTHER): Payer: Medicaid Other | Admitting: *Deleted

## 2016-06-12 DIAGNOSIS — I441 Atrioventricular block, second degree: Secondary | ICD-10-CM | POA: Diagnosis not present

## 2016-06-14 NOTE — Progress Notes (Signed)
Remote pacemaker transmission.   

## 2016-06-21 ENCOUNTER — Encounter: Payer: Self-pay | Admitting: Cardiology

## 2016-06-25 LAB — CUP PACEART REMOTE DEVICE CHECK
Date Time Interrogation Session: 20180612121213
Implantable Lead Implant Date: 20040528
Implantable Lead Location: 753859
Implantable Pulse Generator Implant Date: 20150904
MDC IDC LEAD IMPLANT DT: 20040528
MDC IDC LEAD LOCATION: 753860
MDC IDC PG SERIAL: 7669661

## 2016-09-11 ENCOUNTER — Encounter: Payer: Medicaid Other | Admitting: *Deleted

## 2016-09-13 ENCOUNTER — Encounter: Payer: Self-pay | Admitting: Cardiology

## 2016-09-24 ENCOUNTER — Ambulatory Visit (INDEPENDENT_AMBULATORY_CARE_PROVIDER_SITE_OTHER): Payer: Medicaid Other | Admitting: *Deleted

## 2016-09-24 DIAGNOSIS — I441 Atrioventricular block, second degree: Secondary | ICD-10-CM | POA: Diagnosis not present

## 2016-09-26 LAB — CUP PACEART REMOTE DEVICE CHECK
Date Time Interrogation Session: 20180913202127
Implantable Lead Implant Date: 20040528
Implantable Lead Location: 753859
Lead Channel Setting Pacing Amplitude: 2.5 V
Lead Channel Setting Pacing Amplitude: 2.5 V
Lead Channel Setting Pacing Pulse Width: 0.5 ms
Lead Channel Setting Sensing Sensitivity: 2 mV
MDC IDC LEAD IMPLANT DT: 20040528
MDC IDC LEAD LOCATION: 753860
MDC IDC PG IMPLANT DT: 20150904
MDC IDC PG SERIAL: 7669661
Pulse Gen Model: 2240

## 2016-09-26 NOTE — Progress Notes (Signed)
Remote pacemaker transmission.   

## 2016-09-27 ENCOUNTER — Encounter: Payer: Self-pay | Admitting: Cardiology

## 2016-11-06 DIAGNOSIS — M6281 Muscle weakness (generalized): Secondary | ICD-10-CM | POA: Insufficient documentation

## 2016-11-06 DIAGNOSIS — Z96652 Presence of left artificial knee joint: Secondary | ICD-10-CM | POA: Insufficient documentation

## 2016-11-06 DIAGNOSIS — N183 Chronic kidney disease, stage 3 unspecified: Secondary | ICD-10-CM | POA: Insufficient documentation

## 2016-12-25 ENCOUNTER — Encounter: Payer: Self-pay | Admitting: Internal Medicine

## 2016-12-26 ENCOUNTER — Ambulatory Visit (INDEPENDENT_AMBULATORY_CARE_PROVIDER_SITE_OTHER): Payer: Medicare Other | Admitting: *Deleted

## 2016-12-26 DIAGNOSIS — I441 Atrioventricular block, second degree: Secondary | ICD-10-CM

## 2016-12-26 NOTE — Progress Notes (Signed)
Remote pacemaker transmission.   

## 2016-12-31 ENCOUNTER — Encounter: Payer: Self-pay | Admitting: Cardiology

## 2017-01-24 LAB — CUP PACEART REMOTE DEVICE CHECK
Implantable Lead Implant Date: 20040528
Implantable Lead Implant Date: 20040528
Implantable Lead Location: 753859
Implantable Lead Location: 753860
Lead Channel Setting Pacing Amplitude: 2.5 V
Lead Channel Setting Pacing Amplitude: 2.5 V
Lead Channel Setting Sensing Sensitivity: 2 mV
MDC IDC PG IMPLANT DT: 20150904
MDC IDC PG SERIAL: 7669661
MDC IDC SESS DTM: 20190111201817
MDC IDC SET LEADCHNL RV PACING PULSEWIDTH: 0.5 ms
Pulse Gen Model: 2240

## 2017-02-20 ENCOUNTER — Ambulatory Visit (INDEPENDENT_AMBULATORY_CARE_PROVIDER_SITE_OTHER): Payer: Medicare (Managed Care) | Admitting: Internal Medicine

## 2017-02-20 ENCOUNTER — Encounter: Payer: Self-pay | Admitting: *Deleted

## 2017-02-20 ENCOUNTER — Encounter: Payer: Self-pay | Admitting: Internal Medicine

## 2017-02-20 VITALS — BP 134/86 | HR 73 | Ht 68.0 in | Wt 294.0 lb

## 2017-02-20 DIAGNOSIS — I441 Atrioventricular block, second degree: Secondary | ICD-10-CM

## 2017-02-20 DIAGNOSIS — Z950 Presence of cardiac pacemaker: Secondary | ICD-10-CM

## 2017-02-20 LAB — CUP PACEART INCLINIC DEVICE CHECK
Battery Remaining Longevity: 142 mo
Brady Statistic RA Percent Paced: 3.6 %
Implantable Lead Implant Date: 20040528
Implantable Lead Location: 753860
Implantable Pulse Generator Implant Date: 20150904
Lead Channel Impedance Value: 212.5 Ohm
Lead Channel Pacing Threshold Amplitude: 0.5 V
Lead Channel Pacing Threshold Amplitude: 0.5 V
Lead Channel Pacing Threshold Amplitude: 0.75 V
Lead Channel Pacing Threshold Amplitude: 0.75 V
Lead Channel Pacing Threshold Pulse Width: 0.5 ms
Lead Channel Pacing Threshold Pulse Width: 0.5 ms
Lead Channel Pacing Threshold Pulse Width: 0.5 ms
Lead Channel Sensing Intrinsic Amplitude: 4.5 mV
Lead Channel Sensing Intrinsic Amplitude: 9 mV
Lead Channel Setting Pacing Amplitude: 2.5 V
Lead Channel Setting Pacing Pulse Width: 0.5 ms
MDC IDC LEAD IMPLANT DT: 20040528
MDC IDC LEAD LOCATION: 753859
MDC IDC MSMT BATTERY VOLTAGE: 3.01 V
MDC IDC MSMT LEADCHNL RA PACING THRESHOLD PULSEWIDTH: 0.5 ms
MDC IDC MSMT LEADCHNL RV IMPEDANCE VALUE: 312.5 Ohm
MDC IDC SESS DTM: 20190207133239
MDC IDC SET LEADCHNL RV PACING AMPLITUDE: 2.5 V
MDC IDC SET LEADCHNL RV SENSING SENSITIVITY: 2 mV
MDC IDC STAT BRADY RV PERCENT PACED: 4.9 %
Pulse Gen Model: 2240
Pulse Gen Serial Number: 7669661

## 2017-02-20 NOTE — Progress Notes (Signed)
HPI [name] returns today for followup. He is a pleasant 67 yo morbidly obese man, with a h/o HTN, obesity, s/p PPM insertion for high grade heart block. He is now living in assisted living. He denies chest pain or sob. He has become more sedentary. He has gained almost 30 lbs in the past year. Allergies  Allergen Reactions  . Nicotine     The patch caused blisters Other reaction(s): Other (See Comments) The patch caused blisters     Current Outpatient Medications  Medication Sig Dispense Refill  . ARIPiprazole (ABILIFY) 10 MG tablet Take 10 mg by mouth daily.    Marland Kitchen aspirin 81 MG tablet Take 81 mg by mouth daily.    . busPIRone (BUSPAR) 7.5 MG tablet Take 7.5 mg by mouth 3 (three) times daily.    . divalproex (DEPAKOTE ER) 500 MG 24 hr tablet Take 2,000 mg by mouth at bedtime.  1  . famotidine (PEPCID) 20 MG tablet Take 20 mg by mouth at bedtime.    . gabapentin (NEURONTIN) 100 MG capsule Take 1 capsule by mouth at bedtime.    Marland Kitchen HYDROcodone-acetaminophen (NORCO/VICODIN) 5-325 MG tablet Take 1 tablet by mouth 3 (three) times daily.    Marland Kitchen lisinopril-hydrochlorothiazide (PRINZIDE,ZESTORETIC) 20-25 MG tablet Take 1 tablet by mouth daily.    . naproxen (NAPROSYN) 250 MG tablet Take 750 mg by mouth every morning.    . pravastatin (PRAVACHOL) 80 MG tablet Take 80 mg by mouth at bedtime.     . primidone (MYSOLINE) 250 MG tablet Take 250 mg by mouth at bedtime.    Marland Kitchen rOPINIRole (REQUIP) 0.5 MG tablet Take 0.5 mg by mouth at bedtime.    . sertraline (ZOLOFT) 100 MG tablet Take 200 mg by mouth daily.    . traZODone (DESYREL) 50 MG tablet Take 125 mg by mouth at bedtime.    . varenicline (CHANTIX CONTINUING MONTH PAK) 1 MG tablet Take 1 tablet (1 mg total) by mouth 2 (two) times daily. 60 tablet 2   No current facility-administered medications for this visit.      Past Medical History:  Diagnosis Date  . Anxiety   . Asthma   . Bipolar disorder (HCC)   . Bone spur   . CAD (coronary  artery disease)   . Cardiac pacemaker in situ 06/11/02   Dual chamber PM for symptomatic intermittent high-grade heart block with syncope  . Cervical radiculopathy   . Chest pain 07/01/13   Went to ED  . CHF (congestive heart failure) (HCC)   . COPD (chronic obstructive pulmonary disease) (HCC)   . Depression   . DJD (degenerative joint disease), cervical   . Hyperlipidemia   . Hypertension   . Knee pain   . Myocardial infarction (HCC)   . Neck pain 06/2013   Left sided with pain radiating down left arm. Pain associated with numbness in left arm. Worse with movement.   . Obesity   . OSA (obstructive sleep apnea)    stopped using CPAP 2008  . Presence of permanent cardiac pacemaker    Dr Sharrell Ku  . Shortness of breath dyspnea    with exertion  . Syncope     ROS:   All systems reviewed and negative except as noted in the HPI.   Past Surgical History:  Procedure Laterality Date  . APPENDECTOMY  1975  . INSERT / REPLACE / REMOVE PACEMAKER  09/17/13  . KNEE ARTHROSCOPY Left 06/17/05  . PACEMAKER  GENERATOR CHANGE N/A 09/17/2013   Procedure: PACEMAKER GENERATOR CHANGE;  Surgeon: Marinus MawGregg W Undra Trembath, MD;  [facility];  Service: Cardiovascular;  Laterality: N/A;  . PACEMAKER INSERTION  06/11/02   Dual chamber PM for symptomatic intermittent high-grade heart block with syncope  . SHOULDER ARTHROSCOPY Right 11/11/01  . TOTAL KNEE ARTHROPLASTY Left 01/21/2014   Procedure: LEFT TOTAL KNEE ARTHROPLASTY;  Surgeon: Verlee RossettiSteven R Norris, MD;  [facility];  Service: Orthopedics;  Laterality: Left;     Family History  Problem Relation Age of Onset  . Heart disease Mother   . Diabetes Mother      Social History   Socioeconomic History  . Marital status: Married    Spouse name: Not on file  . Number of children: Not on file  . Years of education: Not on file  . Highest education level: Not on file  Social Needs  . Financial resource strain: Not on file  . Food insecurity -  worry: Not on file  . Food insecurity - inability: Not on file  . Transportation needs - medical: Not on file  . Transportation needs - non-medical: Not on file  Occupational History  . Not on file  Tobacco Use  . Smoking status: Current Some Day Smoker    Packs/day: 0.50    Years: 42.00    Pack years: 21.00    Types: Cigarettes, Cigars  . Smokeless tobacco: Never Used  Substance and Sexual Activity  . Alcohol use: No  . Drug use: No  . Sexual activity: Not on file  Other Topics Concern  . Not on file  Social History Narrative  . Not on file     BP 134/86   Pulse 73   Ht 5\' 8"  (1.727 m)   Wt 294 lb (133.4 kg)   BMI 44.70 kg/m   Physical Exam:  Morbidly obese appearing 67 yo man, NAD HEENT: Unremarkable Neck: unable to assess JVD, no thyromegally Lymphatics:  No adenopathy Back:  No CVA tenderness Lungs:  Clear with no wheezes HEART:  Regular rate rhythm, no murmurs, no rubs, no clicks Abd:  soft, positive bowel sounds, no organomegally, no rebound, no guarding Ext:  2 plus pulses, no edema, no cyanosis, no clubbing Skin:  No rashes no nodules Neuro:  CN II through XII intact, motor grossly intact  EKG - NSR with nml axis and intervals.  DEVICE  Normal device function.  See PaceArt for details.   Assess/Plan: 1. Mobitz 2 second degree AV block. - he is stable after PPM insertion with no symptoms. 2. PM - his St. Jude DDD PM is working normally. He does have some atrial lead noise and a reduced pacing impedence. His sensing and thresholds are good. 3. Obesity - he has gained 30 lbs. Since his last visit. I have strongly encouraged the patient to lose weight.  [name],M.D.

## 2017-02-20 NOTE — Patient Instructions (Signed)

## 2017-03-27 ENCOUNTER — Ambulatory Visit (INDEPENDENT_AMBULATORY_CARE_PROVIDER_SITE_OTHER): Payer: Medicare (Managed Care) | Admitting: *Deleted

## 2017-03-27 DIAGNOSIS — I441 Atrioventricular block, second degree: Secondary | ICD-10-CM

## 2017-03-27 NOTE — Progress Notes (Signed)
Remote pacemaker transmission.   

## 2017-03-28 ENCOUNTER — Encounter: Payer: Self-pay | Admitting: Cardiology

## 2017-04-13 LAB — CUP PACEART REMOTE DEVICE CHECK
Date Time Interrogation Session: 20190331154929
Implantable Lead Implant Date: 20040528
Implantable Lead Location: 753859
Lead Channel Setting Pacing Amplitude: 2.5 V
Lead Channel Setting Pacing Amplitude: 2.5 V
Lead Channel Setting Sensing Sensitivity: 2 mV
MDC IDC LEAD IMPLANT DT: 20040528
MDC IDC LEAD LOCATION: 753860
MDC IDC PG IMPLANT DT: 20150904
MDC IDC SET LEADCHNL RV PACING PULSEWIDTH: 0.5 ms
Pulse Gen Serial Number: 7669661

## 2017-06-26 ENCOUNTER — Ambulatory Visit (INDEPENDENT_AMBULATORY_CARE_PROVIDER_SITE_OTHER): Payer: Medicare (Managed Care) | Admitting: *Deleted

## 2017-06-26 DIAGNOSIS — I442 Atrioventricular block, complete: Secondary | ICD-10-CM

## 2017-06-26 NOTE — Progress Notes (Signed)
Remote pacemaker transmission.   

## 2017-07-18 LAB — CUP PACEART REMOTE DEVICE CHECK
Battery Remaining Percentage: 95 %
Battery Voltage: 3.01 V
Brady Statistic RA Percent Paced: 1 % — CL
Brady Statistic RV Percent Paced: 5.7 %
Date Time Interrogation Session: 20190705085612
Implantable Lead Implant Date: 20040528
Implantable Lead Location: 753859
Implantable Lead Location: 753860
Lead Channel Impedance Value: 210 Ohm
Lead Channel Impedance Value: 350 Ohm
Lead Channel Setting Pacing Amplitude: 2.5 V
Lead Channel Setting Pacing Amplitude: 2.5 V
Lead Channel Setting Pacing Pulse Width: 0.5 ms
Lead Channel Setting Sensing Sensitivity: 2 mV
MDC IDC LEAD IMPLANT DT: 20040528
MDC IDC MSMT BATTERY REMAINING LONGEVITY: 93 mo
MDC IDC MSMT LEADCHNL RA SENSING INTR AMPL: 4.7 mV
MDC IDC MSMT LEADCHNL RV SENSING INTR AMPL: 10.2 mV
MDC IDC PG IMPLANT DT: 20150904
Pulse Gen Model: 2240
Pulse Gen Serial Number: 7669661

## 2017-09-25 ENCOUNTER — Encounter: Payer: Medicare (Managed Care) | Admitting: *Deleted

## 2017-09-25 ENCOUNTER — Telehealth: Payer: Self-pay

## 2017-09-25 NOTE — Telephone Encounter (Signed)
LMOVM reminding pt to send remote transmission.   

## 2017-09-26 ENCOUNTER — Encounter: Payer: Self-pay | Admitting: Cardiology

## 2017-09-29 ENCOUNTER — Ambulatory Visit (INDEPENDENT_AMBULATORY_CARE_PROVIDER_SITE_OTHER): Payer: Medicare (Managed Care) | Admitting: *Deleted

## 2017-09-29 DIAGNOSIS — I442 Atrioventricular block, complete: Secondary | ICD-10-CM | POA: Diagnosis not present

## 2017-09-30 NOTE — Progress Notes (Signed)
Remote pacemaker transmission.   

## 2017-10-30 LAB — CUP PACEART REMOTE DEVICE CHECK
Date Time Interrogation Session: 20191017122121
Implantable Lead Implant Date: 20040528
Implantable Lead Implant Date: 20040528
Implantable Lead Location: 753859
MDC IDC LEAD LOCATION: 753860
MDC IDC PG IMPLANT DT: 20150904
Pulse Gen Model: 2240
Pulse Gen Serial Number: 7669661

## 2017-12-29 ENCOUNTER — Ambulatory Visit (INDEPENDENT_AMBULATORY_CARE_PROVIDER_SITE_OTHER): Payer: Medicare (Managed Care)

## 2017-12-29 ENCOUNTER — Telehealth: Payer: Self-pay | Admitting: Cardiology

## 2017-12-29 DIAGNOSIS — I442 Atrioventricular block, complete: Secondary | ICD-10-CM | POA: Diagnosis not present

## 2017-12-29 NOTE — Telephone Encounter (Signed)
Attempted to confirm remote transmission with pt. No answer and was unable to leave a message. Automated message stating that the number is not able to be reached.

## 2017-12-30 NOTE — Progress Notes (Signed)
Remote pacemaker transmission.   

## 2018-02-07 LAB — CUP PACEART REMOTE DEVICE CHECK
Date Time Interrogation Session: 20200125111412
Implantable Lead Implant Date: 20040528
Implantable Lead Implant Date: 20040528
Implantable Lead Location: 753859
Implantable Pulse Generator Implant Date: 20150904
MDC IDC LEAD LOCATION: 753860
Pulse Gen Model: 2240
Pulse Gen Serial Number: 7669661

## 2018-02-25 ENCOUNTER — Encounter: Payer: Self-pay | Admitting: Internal Medicine

## 2018-03-12 ENCOUNTER — Ambulatory Visit (INDEPENDENT_AMBULATORY_CARE_PROVIDER_SITE_OTHER): Payer: Medicare (Managed Care) | Admitting: Internal Medicine

## 2018-03-12 ENCOUNTER — Encounter: Payer: Self-pay | Admitting: Internal Medicine

## 2018-03-12 VITALS — BP 140/76 | HR 79 | Ht 68.0 in | Wt 313.4 lb

## 2018-03-12 DIAGNOSIS — Z950 Presence of cardiac pacemaker: Secondary | ICD-10-CM

## 2018-03-12 DIAGNOSIS — I441 Atrioventricular block, second degree: Secondary | ICD-10-CM

## 2018-03-12 NOTE — Patient Instructions (Signed)

## 2018-03-12 NOTE — Progress Notes (Signed)
HPI [name] returns today for followup. 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 He is now living in assisted living. He denies chest pain or sob. He has become more sedentary. He has gained another 20 lbs in the past year. He had gained 30 lbs the year before that. He has knee problems making him more sedentary. Allergies  Allergen Reactions  . Nicotine     The patch caused blisters Other reaction(s): Other (See Comments) The patch caused blisters     Current Outpatient Medications  Medication Sig Dispense Refill  . amantadine (SYMMETREL) 100 MG capsule Take 1 capsule by mouth 2 (two) times daily.    Marland Kitchen amLODipine (NORVASC) 10 MG tablet Take 1 tablet by mouth daily.    . ARIPiprazole (ABILIFY) 15 MG tablet Take 1 tablet by mouth daily.    Marland Kitchen aspirin 81 MG tablet Take 81 mg by mouth daily.    Marland Kitchen buPROPion (WELLBUTRIN XL) 300 MG 24 hr tablet Take 1 tablet by mouth daily.    . busPIRone (BUSPAR) 7.5 MG tablet Take 7.5 mg by mouth 3 (three) times daily.    . Calcium Carbonate-Vit D-Min (CALCIUM 600+D PLUS MINERALS) 600-400 MG-UNIT TABS Take 1 tablet by mouth daily.    . divalproex (DEPAKOTE ER) 500 MG 24 hr tablet Take 2,000 mg by mouth at bedtime.  1  . famotidine (PEPCID) 20 MG tablet Take 20 mg by mouth at bedtime.    . fenofibrate (TRICOR) 48 MG tablet Take 1 tablet by mouth daily.    Marland Kitchen gabapentin (NEURONTIN) 100 MG capsule Take 1 capsule by mouth at bedtime.    . hydrochlorothiazide (HYDRODIURIL) 25 MG tablet Take 1 tablet by mouth daily.    Marland Kitchen HYDROcodone-acetaminophen (NORCO/VICODIN) 5-325 MG tablet Take 1 tablet by mouth 3 (three) times daily.    Marland Kitchen lisinopril (PRINIVIL,ZESTRIL) 40 MG tablet Take 1 tablet by mouth daily.    . naproxen (NAPROSYN) 250 MG tablet Take 750 mg by mouth every morning.    . pravastatin (PRAVACHOL) 80 MG tablet Take 80 mg by mouth at bedtime.     . primidone (MYSOLINE) 250 MG  tablet Take 250 mg by mouth at bedtime.    Marland Kitchen rOPINIRole (REQUIP) 0.5 MG tablet Take 0.5 mg by mouth at bedtime.    . tamsulosin (FLOMAX) 0.4 MG CAPS capsule Take 1 capsule by mouth daily.    . temazepam (RESTORIL) 7.5 MG capsule Take 1 capsule by mouth daily.     No current facility-administered medications for this visit.      Past Medical History:  Diagnosis Date  . Anxiety   . Asthma   . Bipolar disorder (HCC)   . Bone spur   . CAD (coronary artery disease)   . Cardiac pacemaker in situ 06/11/02   Dual chamber PM for symptomatic intermittent high-grade heart block with syncope  . Cervical radiculopathy   . Chest pain 07/01/13   Went to ED  . CHF (congestive heart failure) (HCC)   . COPD (chronic obstructive pulmonary disease) (HCC)   . Depression   . DJD (degenerative joint disease), cervical   . Hyperlipidemia   . Hypertension   . Knee pain   . Myocardial infarction (HCC)   . Neck pain 06/2013   Left sided with pain radiating down left arm. Pain associated with numbness in left arm. Worse with movement.   Marland Kitchen  Obesity   . OSA (obstructive sleep apnea)    stopped using CPAP 2008  . Presence of permanent cardiac pacemaker    Dr Sharrell Ku  . Shortness of breath dyspnea    with exertion  . Syncope     ROS:   All systems reviewed and negative except as noted in the HPI.   Past Surgical History:  Procedure Laterality Date  . APPENDECTOMY  1975  . INSERT / REPLACE / REMOVE PACEMAKER  09/17/13  . KNEE ARTHROSCOPY Left 06/17/05  . PACEMAKER GENERATOR CHANGE N/A 09/17/2013   Procedure: PACEMAKER GENERATOR CHANGE;  Surgeon: Marinus Maw, MD;  Location: [facility] CATH LAB;  Service: Cardiovascular;  Laterality: N/A;  . PACEMAKER INSERTION  06/11/02   Dual chamber PM for symptomatic intermittent high-grade heart block with syncope  . SHOULDER ARTHROSCOPY Right 11/11/01  . TOTAL KNEE ARTHROPLASTY Left 01/21/2014   Procedure: LEFT TOTAL KNEE ARTHROPLASTY;  Surgeon: Verlee Rossetti, MD;   Location: [facility] OR;  Service: Orthopedics;  Laterality: Left;     Family History  Problem Relation Age of Onset  . Heart disease Mother   . Diabetes Mother      Social History   Socioeconomic History  . Marital status: Married    Spouse name: Not on file  . Number of children: Not on file  . Years of education: Not on file  . Highest education level: Not on file  Occupational History  . Not on file  Social Needs  . Financial resource strain: Not on file  . Food insecurity:    Worry: Not on file    Inability: Not on file  . Transportation needs:    Medical: Not on file    Non-medical: Not on file  Tobacco Use  . Smoking status: Current Some Day Smoker    Packs/day: 0.50    Years: 42.00    Pack years: 21.00    Types: Cigarettes, Cigars  . Smokeless tobacco: Never Used  Substance and Sexual Activity  . Alcohol use: No  . Drug use: No  . Sexual activity: Not on file  Lifestyle  . Physical activity:    Days per week: Not on file    Minutes per session: Not on file  . Stress: Not on file  Relationships  . Social connections:    Talks on phone: Not on file    Gets together: Not on file    Attends religious service: Not on file    Active member of club or organization: Not on file    Attends meetings of clubs or organizations: Not on file    Relationship status: Not on file  . Intimate partner violence:    Fear of current or ex partner: Not on file    Emotionally abused: Not on file    Physically abused: Not on file    Forced sexual activity: Not on file  Other Topics Concern  . Not on file  Social History Narrative  . Not on file     BP 140/76   Pulse 79   Ht 5\' 8"  (1.727 m)   Wt (!) 313 lb 6.4 oz (142.2 kg)   SpO2 98%   BMI 47.65 kg/m   Physical Exam:  Morbidly obese appearing NAD HEENT: Unremarkable Neck:  6 cm JVD, no thyromegally Lymphatics:  No adenopathy Back:  No CVA tenderness Lungs:  Clear with no wheezes HEART:  Regular rate rhythm, no  murmurs, no rubs, no clicks Abd:  Obese,  soft, positive bowel sounds, no organomegally, no rebound, no guarding Ext:  2 plus pulses, no edema, no cyanosis, no clubbing Skin:  No rashes no nodules Neuro:  CN II through XII intact, motor grossly intact   DEVICE  Normal device function.  See PaceArt for details. He has some noise with oversensing on both leads.  Assess/Plan: 1. Mobitz 2, second degree heart block - he is doing well and is not dependent today. 2. PPM - he is having some oversensing of noise that we have tried to program around. 3. Obesity - he continues to gain weight. I have strongly encouraged the patient to lose weight.   Leonia Reeves.D.

## 2018-03-13 LAB — CUP PACEART INCLINIC DEVICE CHECK
Battery Remaining Longevity: 144 mo
Battery Voltage: 2.99 V
Brady Statistic RA Percent Paced: 0.48 %
Brady Statistic RV Percent Paced: 3.3 %
Date Time Interrogation Session: 20200227193247
Implantable Lead Implant Date: 20040528
Implantable Lead Implant Date: 20040528
Implantable Lead Location: 753860
Lead Channel Impedance Value: 225 Ohm
Lead Channel Impedance Value: 337.5 Ohm
Lead Channel Pacing Threshold Amplitude: 0.75 V
Lead Channel Pacing Threshold Amplitude: 0.75 V
Lead Channel Pacing Threshold Amplitude: 1 V
Lead Channel Pacing Threshold Amplitude: 1 V
Lead Channel Pacing Threshold Pulse Width: 0.5 ms
Lead Channel Pacing Threshold Pulse Width: 0.5 ms
Lead Channel Pacing Threshold Pulse Width: 0.5 ms
Lead Channel Pacing Threshold Pulse Width: 0.5 ms
Lead Channel Sensing Intrinsic Amplitude: 4.1 mV
Lead Channel Setting Pacing Amplitude: 2.5 V
Lead Channel Setting Pacing Amplitude: 2.5 V
Lead Channel Setting Sensing Sensitivity: 4 mV
MDC IDC LEAD LOCATION: 753859
MDC IDC MSMT LEADCHNL RV SENSING INTR AMPL: 8.4 mV
MDC IDC PG IMPLANT DT: 20150904
MDC IDC SET LEADCHNL RV PACING PULSEWIDTH: 0.5 ms
Pulse Gen Model: 2240
Pulse Gen Serial Number: 7669661

## 2018-03-30 ENCOUNTER — Encounter: Payer: Medicaid Other | Admitting: *Deleted

## 2018-03-30 ENCOUNTER — Other Ambulatory Visit: Payer: Self-pay

## 2018-03-31 ENCOUNTER — Telehealth: Payer: Self-pay

## 2018-03-31 NOTE — Telephone Encounter (Signed)
Unable to leave a message for patient to remind of missed remote transmission.  

## 2018-05-25 ENCOUNTER — Ambulatory Visit (INDEPENDENT_AMBULATORY_CARE_PROVIDER_SITE_OTHER): Payer: Medicare (Managed Care) | Admitting: *Deleted

## 2018-05-25 ENCOUNTER — Other Ambulatory Visit: Payer: Self-pay

## 2018-05-25 DIAGNOSIS — I441 Atrioventricular block, second degree: Secondary | ICD-10-CM

## 2018-05-25 DIAGNOSIS — I442 Atrioventricular block, complete: Secondary | ICD-10-CM

## 2018-05-26 LAB — CUP PACEART REMOTE DEVICE CHECK
Date Time Interrogation Session: 20200512092959
Implantable Lead Implant Date: 20040528
Implantable Lead Implant Date: 20040528
Implantable Lead Location: 753859
Implantable Lead Location: 753860
Implantable Pulse Generator Implant Date: 20150904
Pulse Gen Model: 2240
Pulse Gen Serial Number: 7669661

## 2018-06-01 NOTE — Progress Notes (Signed)
Remote pacemaker transmission.   

## 2018-08-24 ENCOUNTER — Encounter: Payer: Medicare (Managed Care) | Admitting: *Deleted

## 2018-09-01 ENCOUNTER — Ambulatory Visit (INDEPENDENT_AMBULATORY_CARE_PROVIDER_SITE_OTHER): Payer: Medicare (Managed Care) | Admitting: *Deleted

## 2018-09-01 DIAGNOSIS — I441 Atrioventricular block, second degree: Secondary | ICD-10-CM

## 2018-09-02 LAB — CUP PACEART REMOTE DEVICE CHECK
Date Time Interrogation Session: 20200819081024
Implantable Lead Implant Date: 20040528
Implantable Lead Implant Date: 20040528
Implantable Lead Location: 753859
Implantable Lead Location: 753860
Implantable Pulse Generator Implant Date: 20150904
Pulse Gen Model: 2240
Pulse Gen Serial Number: 7669661

## 2018-09-09 ENCOUNTER — Encounter: Payer: Self-pay | Admitting: Cardiology

## 2018-09-09 NOTE — Progress Notes (Signed)
Remote pacemaker transmission.   

## 2018-12-01 ENCOUNTER — Encounter: Payer: Medicare (Managed Care) | Admitting: *Deleted

## 2018-12-03 ENCOUNTER — Telehealth: Payer: Self-pay

## 2018-12-03 NOTE — Telephone Encounter (Signed)
Left message for patient to remind of missed remote transmission.  

## 2018-12-24 ENCOUNTER — Ambulatory Visit (INDEPENDENT_AMBULATORY_CARE_PROVIDER_SITE_OTHER): Payer: Medicare (Managed Care) | Admitting: *Deleted

## 2018-12-24 DIAGNOSIS — I441 Atrioventricular block, second degree: Secondary | ICD-10-CM

## 2018-12-24 LAB — CUP PACEART REMOTE DEVICE CHECK
Battery Remaining Longevity: 108 mo
Battery Remaining Percentage: 98 %
Battery Voltage: 2.98 V
Brady Statistic RA Percent Paced: 20 %
Brady Statistic RV Percent Paced: 1 %
Date Time Interrogation Session: 20201210151756
Implantable Lead Implant Date: 20040528
Implantable Lead Implant Date: 20040528
Implantable Lead Location: 753859
Implantable Lead Location: 753860
Implantable Pulse Generator Implant Date: 20150904
Lead Channel Impedance Value: 210 Ohm
Lead Channel Impedance Value: 330 Ohm
Lead Channel Sensing Intrinsic Amplitude: 2.7 mV
Lead Channel Sensing Intrinsic Amplitude: 6.6 mV
Lead Channel Setting Pacing Amplitude: 2.5 V
Lead Channel Setting Pacing Amplitude: 2.5 V
Lead Channel Setting Pacing Pulse Width: 0.5 ms
Lead Channel Setting Sensing Sensitivity: 4 mV
Pulse Gen Model: 2240
Pulse Gen Serial Number: 7669661

## 2019-01-30 NOTE — Progress Notes (Signed)
PPM Remote  

## 2019-06-15 DEATH — deceased

## 2019-06-25 ENCOUNTER — Telehealth: Payer: Self-pay

## 2019-06-25 NOTE — Telephone Encounter (Signed)
Unable to speak  with patient to remind of missed remote transmission
